# Patient Record
Sex: Male | Born: 1995 | Race: White | Hispanic: No | Marital: Married | Smoking: Current every day smoker
Health system: Southern US, Community
[De-identification: ages and names within clinical notes are randomized; demographics above are authoritative.]

## PROBLEM LIST (undated history)

## (undated) DIAGNOSIS — F329 Major depressive disorder, single episode, unspecified: Secondary | ICD-10-CM

## (undated) DIAGNOSIS — F419 Anxiety disorder, unspecified: Secondary | ICD-10-CM

## (undated) DIAGNOSIS — J45909 Unspecified asthma, uncomplicated: Secondary | ICD-10-CM

## (undated) DIAGNOSIS — I10 Essential (primary) hypertension: Secondary | ICD-10-CM

## (undated) DIAGNOSIS — F319 Bipolar disorder, unspecified: Secondary | ICD-10-CM

## (undated) DIAGNOSIS — F4310 Post-traumatic stress disorder, unspecified: Secondary | ICD-10-CM

## (undated) DIAGNOSIS — J449 Chronic obstructive pulmonary disease, unspecified: Secondary | ICD-10-CM

## (undated) DIAGNOSIS — F909 Attention-deficit hyperactivity disorder, unspecified type: Secondary | ICD-10-CM

## (undated) DIAGNOSIS — N289 Disorder of kidney and ureter, unspecified: Secondary | ICD-10-CM

## (undated) DIAGNOSIS — F32A Depression, unspecified: Secondary | ICD-10-CM

## (undated) DIAGNOSIS — R480 Dyslexia and alexia: Secondary | ICD-10-CM

## (undated) HISTORY — PX: EXPLORATORY LAPAROTOMY: SUR591

---
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
## 2013-01-17 ENCOUNTER — Emergency Department: Payer: Self-pay | Admitting: Emergency Medicine

## 2013-05-10 ENCOUNTER — Emergency Department: Payer: Self-pay | Admitting: Emergency Medicine

## 2014-06-05 ENCOUNTER — Emergency Department: Payer: Self-pay | Admitting: Emergency Medicine

## 2014-06-06 LAB — CBC
HCT: 38.3 % — ABNORMAL LOW (ref 40.0–52.0)
HGB: 12.7 g/dL — ABNORMAL LOW (ref 13.0–18.0)
MCH: 30 pg (ref 26.0–34.0)
MCHC: 33.2 g/dL (ref 32.0–36.0)
MCV: 90 fL (ref 80–100)
Platelet: 217 10*3/uL (ref 150–440)
RBC: 4.24 10*6/uL — ABNORMAL LOW (ref 4.40–5.90)
RDW: 12.8 % (ref 11.5–14.5)
WBC: 5.9 10*3/uL (ref 3.8–10.6)

## 2014-06-06 LAB — BASIC METABOLIC PANEL
ANION GAP: 5 — AB (ref 7–16)
BUN: 6 mg/dL — ABNORMAL LOW (ref 7–18)
CHLORIDE: 109 mmol/L — AB (ref 98–107)
CO2: 25 mmol/L (ref 21–32)
CREATININE: 0.92 mg/dL (ref 0.60–1.30)
Calcium, Total: 8.9 mg/dL (ref 8.5–10.1)
EGFR (African American): >60
GLUCOSE: 89 mg/dL (ref 65–99)
OSMOLALITY: 275 (ref 275–301)
Potassium: 4.2 mmol/L (ref 3.5–5.1)
SODIUM: 139 mmol/L (ref 136–145)

## 2015-05-23 ENCOUNTER — Emergency Department
Admission: EM | Admit: 2015-05-23 | Discharge: 2015-05-23 | Disposition: A | Discharge status: Home / Self Care | Payer: Self-pay | Attending: Emergency Medicine | Admitting: Emergency Medicine

## 2015-05-23 ENCOUNTER — Emergency Department: Payer: Self-pay

## 2015-05-23 ENCOUNTER — Encounter: Payer: Self-pay | Admitting: Urgent Care

## 2015-05-23 DIAGNOSIS — J441 Chronic obstructive pulmonary disease with (acute) exacerbation: Secondary | ICD-10-CM | POA: Insufficient documentation

## 2015-05-23 DIAGNOSIS — J45901 Unspecified asthma with (acute) exacerbation: Secondary | ICD-10-CM

## 2015-05-23 DIAGNOSIS — Z720 Tobacco use: Secondary | ICD-10-CM | POA: Insufficient documentation

## 2015-05-23 HISTORY — DX: Chronic obstructive pulmonary disease, unspecified: J44.9

## 2015-05-23 HISTORY — DX: Unspecified asthma, uncomplicated: J45.909

## 2015-05-23 HISTORY — DX: Disorder of kidney and ureter, unspecified: N28.9

## 2015-05-23 MED ORDER — ALBUTEROL SULFATE (2.5 MG/3ML) 0.083% IN NEBU
2.5000 mg | INHALATION_SOLUTION | Freq: Once | RESPIRATORY_TRACT | Status: AC
  Administered 2015-05-23: 2.5 mg via RESPIRATORY_TRACT

## 2015-05-23 MED ORDER — ALBUTEROL SULFATE HFA 108 (90 BASE) MCG/ACT IN AERS: 2.0000 | INHALATION_SPRAY | Freq: Four times a day (QID) | RESPIRATORY_TRACT | Status: DC | PRN

## 2015-05-23 MED ORDER — ALBUTEROL SULFATE (2.5 MG/3ML) 0.083% IN NEBU
INHALATION_SOLUTION | RESPIRATORY_TRACT | Status: AC
  Administered 2015-05-23: 2.5 mg via RESPIRATORY_TRACT
  Filled 2015-05-23: qty 3

## 2015-05-23 NOTE — ED Provider Notes (Signed)
[facility] Emergency Department Provider Note  ____________________________________________  Time seen: 2:00 AM  I have reviewed the triage vital signs and the nursing notes.   HISTORY  Chief Complaint Asthma     HPI Christopher Christensen is a 19 y.o. male 's with dyspnea and nonproductive cough and wheezing times one day. Patient states that he started breathing a [name] paper bag to help with his dyspnea which improved it slightly. Patient denies any chest pain   Past Medical History  Diagnosis Date  . Asthma   . Renal disorder   . COPD (chronic obstructive pulmonary disease)     There are no active problems to display for this patient.   Past surgical history None No current outpatient prescriptions on file.  Allergies No known drug allergies No family history on file.  Social History Social History  Substance Use Topics  . Smoking status: Current Every Day Smoker  . Smokeless tobacco: None  . Alcohol Use: Yes    Review of Systems  Constitutional: Negative for fever. Eyes: Negative for visual changes. ENT: Negative for sore throat. Cardiovascular: Negative for chest pain. Respiratory: Positive for shortness of breath. Gastrointestinal: Negative for abdominal pain, vomiting and diarrhea. Genitourinary: Negative for dysuria. Musculoskeletal: Negative for back pain. Skin: Negative for rash. Neurological: Negative for headaches, focal weakness or numbness.   10-point ROS otherwise negative.  ____________________________________________   PHYSICAL EXAM:  VITAL SIGNS: ED Triage Vitals  Enc Vitals Group     BP 05/23/15 0056 123/79 mmHg     Pulse Rate 05/23/15 0056 100     Resp 05/23/15 0056 20     Temp 05/23/15 0056 98.1 F (36.7 C)     Temp Source 05/23/15 0056 Oral     SpO2 05/23/15 0056 100 %     Weight 05/23/15 0056 151 lb (68.493 kg)     Height 05/23/15 0056  (1.727 m)     Head Cir --      Peak Flow --      Pain  Score 05/23/15 0057 0     Pain Loc --      Pain Edu? --      Excl. in GC? --     Constitutional: Alert and oriented. Well appearing and in no distress. Eyes: Conjunctivae are normal. PERRL. Normal extraocular movements. ENT   Head: Normocephalic and atraumatic.   Nose: No congestion/rhinnorhea.   Mouth/Throat: Mucous membranes are moist.   Neck: No stridor. Hematological/Lymphatic/Immunilogical: No cervical lymphadenopathy. Cardiovascular: Normal rate, regular rhythm. Normal and symmetric distal pulses are present in all extremities. No murmurs, rubs, or gallops. Respiratory: Normal respiratory effort without tachypnea nor retractions. Breath sounds are clear and equal bilaterally. Mild expiratory wheezes Gastrointestinal: Soft and nontender. No distention. There is no CVA tenderness. Genitourinary: deferred Musculoskeletal: Nontender with normal range of motion in all extremities. No joint effusions.  No lower extremity tenderness nor edema. Neurologic:  Normal speech and language. No gross focal neurologic deficits are appreciated. Speech is normal.  Skin:  Skin is warm, dry and intact. No rash noted. Psychiatric: Mood and affect are normal. Speech and behavior are normal. Patient exhibits appropriate insight and judgment.  __  RAD  INITIAL IMPRESSION / ASSESSMENT AND PLAN / ED COURSE  Pertinent labs & imaging results that were available during my care of the patient were reviewed by me and considered in my medical decision making (see chart for details).  Patient received nebulized albuterol resolution of wheezing. Patient will  be discharged home with an MDI.  ____________________________________________   FINAL CLINICAL IMPRESSION(S) / ED DIAGNOSES  Final diagnoses:  Mild asthma exacerbation      Darci Current, MD 05/23/15 [phone]

## 2015-05-23 NOTE — ED Notes (Signed)
Patient presents with c/o SOB. PMH significant for asthma; used MDI without relief of symptoms. Patient reporting that he has been breathing into a paper bag to help with his SOB. Of note, patient with no increased WOB noted in triage. Patient calm and able to speak in complete sentences; SPO2 100% on RA.

## 2015-05-23 NOTE — ED Notes (Signed)
Upon this RN entry to room, patient appears to be sleeping. Patient presents to ED with c/o SOB since yesterday but had increased this morning. Patient able to speak in complete sentences, respirations even and unlabored, no increased work in breathing noted. Patient alert and oriented x 4, call bell within reach, family at bedside.

## 2015-05-23 NOTE — ED Notes (Signed)

## 2015-05-23 NOTE — Discharge Instructions (Signed)

## 2015-06-20 ENCOUNTER — Encounter: Payer: Self-pay | Admitting: Urgent Care

## 2015-06-20 ENCOUNTER — Emergency Department
Admission: EM | Admit: 2015-06-20 | Discharge: 2015-06-20 | Disposition: A | Discharge status: Home / Self Care | Payer: Self-pay | Attending: Emergency Medicine | Admitting: Emergency Medicine

## 2015-06-20 DIAGNOSIS — Y9289 Other specified places as the place of occurrence of the external cause: Secondary | ICD-10-CM | POA: Insufficient documentation

## 2015-06-20 DIAGNOSIS — Y9389 Activity, other specified: Secondary | ICD-10-CM | POA: Insufficient documentation

## 2015-06-20 DIAGNOSIS — Y990 Civilian activity done for income or pay: Secondary | ICD-10-CM | POA: Insufficient documentation

## 2015-06-20 DIAGNOSIS — W310XXA Contact with mining and earth-drilling machinery, initial encounter: Secondary | ICD-10-CM | POA: Insufficient documentation

## 2015-06-20 DIAGNOSIS — S71131A Puncture wound without foreign body, right thigh, initial encounter: Secondary | ICD-10-CM | POA: Insufficient documentation

## 2015-06-20 DIAGNOSIS — I10 Essential (primary) hypertension: Secondary | ICD-10-CM | POA: Insufficient documentation

## 2015-06-20 DIAGNOSIS — Z720 Tobacco use: Secondary | ICD-10-CM | POA: Insufficient documentation

## 2015-06-20 HISTORY — DX: Anxiety disorder, unspecified: F41.9

## 2015-06-20 HISTORY — DX: Bipolar disorder, unspecified: F31.9

## 2015-06-20 HISTORY — DX: Attention-deficit hyperactivity disorder, unspecified type: F90.9

## 2015-06-20 HISTORY — DX: Essential (primary) hypertension: I10

## 2015-06-20 HISTORY — DX: Post-traumatic stress disorder, unspecified: F43.10

## 2015-06-20 HISTORY — DX: Depression, unspecified: F32.A

## 2015-06-20 HISTORY — DX: Major depressive disorder, single episode, unspecified: F32.9

## 2015-06-20 HISTORY — DX: Dyslexia and alexia: R48.0

## 2015-06-20 MED ORDER — ONDANSETRON 4 MG PO TBDP
4.0000 mg | ORAL_TABLET | Freq: Once | ORAL | Status: AC
  Administered 2015-06-20: 4 mg via ORAL
  Filled 2015-06-20: qty 1

## 2015-06-20 MED ORDER — AMOXICILLIN-POT CLAVULANATE 875-125 MG PO TABS
1.0000 | ORAL_TABLET | Freq: Once | ORAL | Status: AC
  Administered 2015-06-20: 1 via ORAL
  Filled 2015-06-20: qty 1

## 2015-06-20 MED ORDER — AMOXICILLIN-POT CLAVULANATE 875-125 MG PO TABS: 1.0000 | ORAL_TABLET | Freq: Two times a day (BID) | ORAL | Status: DC

## 2015-06-20 MED ORDER — NAPROXEN 500 MG PO TABS: 500.0000 mg | ORAL_TABLET | Freq: Two times a day (BID) | ORAL | Status: AC

## 2015-06-20 MED ORDER — ONDANSETRON HCL 4 MG PO TABS: 4.0000 mg | ORAL_TABLET | Freq: Three times a day (TID) | ORAL | Status: DC | PRN

## 2015-06-20 MED ORDER — NAPROXEN 500 MG PO TABS
500.0000 mg | ORAL_TABLET | ORAL | Status: AC
  Administered 2015-06-20: 500 mg via ORAL
  Filled 2015-06-20: qty 1
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
## 2016-05-05 ENCOUNTER — Encounter: Payer: Self-pay | Admitting: Emergency Medicine

## 2016-05-05 ENCOUNTER — Emergency Department
Admission: EM | Admit: 2016-05-05 | Discharge: 2016-05-05 | Disposition: A | Discharge status: Home / Self Care | Payer: Self-pay | Attending: Emergency Medicine | Admitting: Emergency Medicine

## 2016-05-05 DIAGNOSIS — Z5321 Procedure and treatment not carried out due to patient leaving prior to being seen by health care provider: Secondary | ICD-10-CM | POA: Insufficient documentation

## 2016-05-05 DIAGNOSIS — M545 Low back pain: Secondary | ICD-10-CM | POA: Insufficient documentation

## 2016-05-05 NOTE — ED Notes (Signed)
Pt to desk and states he can not wait any longer.  Pt states he will come back if pain gets worse.
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
## 2016-06-22 ENCOUNTER — Encounter: Payer: Self-pay | Admitting: Psychiatry

## 2016-06-22 ENCOUNTER — Inpatient Hospital Stay
Admission: AD | Admit: 2016-06-22 | Discharge: 2016-06-23 | DRG: 881 | Disposition: A | Discharge status: Home / Self Care | Payer: Self-pay | Source: Ambulatory Visit | Attending: Psychiatry | Admitting: Psychiatry

## 2016-06-22 ENCOUNTER — Emergency Department
Admission: EM | Admit: 2016-06-22 | Discharge: 2016-06-22 | Disposition: A | Discharge status: Other Acute Inpatient Hospital | Payer: Self-pay | Attending: Emergency Medicine | Admitting: Emergency Medicine

## 2016-06-22 DIAGNOSIS — F1120 Opioid dependence, uncomplicated: Secondary | ICD-10-CM | POA: Diagnosis present

## 2016-06-22 DIAGNOSIS — Z639 Problem related to primary support group, unspecified: Secondary | ICD-10-CM

## 2016-06-22 DIAGNOSIS — F909 Attention-deficit hyperactivity disorder, unspecified type: Secondary | ICD-10-CM | POA: Insufficient documentation

## 2016-06-22 DIAGNOSIS — F3162 Bipolar disorder, current episode mixed, moderate: Secondary | ICD-10-CM | POA: Insufficient documentation

## 2016-06-22 DIAGNOSIS — G4700 Insomnia, unspecified: Secondary | ICD-10-CM | POA: Diagnosis present

## 2016-06-22 DIAGNOSIS — F418 Other specified anxiety disorders: Secondary | ICD-10-CM | POA: Insufficient documentation

## 2016-06-22 DIAGNOSIS — F1220 Cannabis dependence, uncomplicated: Secondary | ICD-10-CM | POA: Diagnosis present

## 2016-06-22 DIAGNOSIS — F1210 Cannabis abuse, uncomplicated: Secondary | ICD-10-CM | POA: Insufficient documentation

## 2016-06-22 DIAGNOSIS — J45909 Unspecified asthma, uncomplicated: Secondary | ICD-10-CM | POA: Insufficient documentation

## 2016-06-22 DIAGNOSIS — Z823 Family history of stroke: Secondary | ICD-10-CM

## 2016-06-22 DIAGNOSIS — J449 Chronic obstructive pulmonary disease, unspecified: Secondary | ICD-10-CM | POA: Diagnosis present

## 2016-06-22 DIAGNOSIS — F1420 Cocaine dependence, uncomplicated: Secondary | ICD-10-CM | POA: Diagnosis present

## 2016-06-22 DIAGNOSIS — F1994 Other psychoactive substance use, unspecified with psychoactive substance-induced mood disorder: Secondary | ICD-10-CM | POA: Diagnosis present

## 2016-06-22 DIAGNOSIS — F17210 Nicotine dependence, cigarettes, uncomplicated: Secondary | ICD-10-CM | POA: Diagnosis present

## 2016-06-22 DIAGNOSIS — F4321 Adjustment disorder with depressed mood: Principal | ICD-10-CM | POA: Diagnosis present

## 2016-06-22 DIAGNOSIS — F1410 Cocaine abuse, uncomplicated: Secondary | ICD-10-CM | POA: Insufficient documentation

## 2016-06-22 DIAGNOSIS — Z79899 Other long term (current) drug therapy: Secondary | ICD-10-CM | POA: Insufficient documentation

## 2016-06-22 DIAGNOSIS — I10 Essential (primary) hypertension: Secondary | ICD-10-CM | POA: Insufficient documentation

## 2016-06-22 DIAGNOSIS — F4310 Post-traumatic stress disorder, unspecified: Secondary | ICD-10-CM | POA: Insufficient documentation

## 2016-06-22 DIAGNOSIS — F17200 Nicotine dependence, unspecified, uncomplicated: Secondary | ICD-10-CM | POA: Diagnosis present

## 2016-06-22 DIAGNOSIS — R480 Dyslexia and alexia: Secondary | ICD-10-CM | POA: Diagnosis present

## 2016-06-22 DIAGNOSIS — R45851 Suicidal ideations: Secondary | ICD-10-CM | POA: Diagnosis present

## 2016-06-22 DIAGNOSIS — Z716 Tobacco abuse counseling: Secondary | ICD-10-CM

## 2016-06-22 LAB — COMPREHENSIVE METABOLIC PANEL
ALBUMIN: 4.6 g/dL (ref 3.5–5.0)
ALK PHOS: 55 U/L (ref 38–126)
ALT: 10 U/L — ABNORMAL LOW (ref 17–63)
AST: 19 U/L (ref 15–41)
Anion gap: 5 (ref 5–15)
BILIRUBIN TOTAL: 0.1 mg/dL — AB (ref 0.3–1.2)
BUN: 8 mg/dL (ref 6–20)
CALCIUM: 9.4 mg/dL (ref 8.9–10.3)
CO2: 29 mmol/L (ref 22–32)
CREATININE: 1.02 mg/dL (ref 0.61–1.24)
Chloride: 103 mmol/L (ref 101–111)
GFR calc Af Amer: >60 mL/min (ref >60)
GLUCOSE: 105 mg/dL — AB (ref 65–99)
Potassium: 3.9 mmol/L (ref 3.5–5.1)
Sodium: 137 mmol/L (ref 135–145)
TOTAL PROTEIN: 7.5 g/dL (ref 6.5–8.1)

## 2016-06-22 LAB — URINE DRUG SCREEN, QUALITATIVE (ARMC ONLY)
AMPHETAMINES, UR SCREEN: NOT DETECTED
BARBITURATES, UR SCREEN: NOT DETECTED
BENZODIAZEPINE, UR SCRN: NOT DETECTED
CANNABINOID 50 NG, UR ~~LOC~~: POSITIVE — AB
Cocaine Metabolite,Ur ~~LOC~~: POSITIVE — AB
MDMA (Ecstasy)Ur Screen: NOT DETECTED
Methadone Scn, Ur: NOT DETECTED
OPIATE, UR SCREEN: POSITIVE — AB
PHENCYCLIDINE (PCP) UR S: NOT DETECTED
Tricyclic, Ur Screen: NOT DETECTED

## 2016-06-22 LAB — CBC
HEMATOCRIT: 41.6 % (ref 40.0–52.0)
Hemoglobin: 14.6 g/dL (ref 13.0–18.0)
MCH: 31.3 pg (ref 26.0–34.0)
MCHC: 35.2 g/dL (ref 32.0–36.0)
MCV: 89 fL (ref 80.0–100.0)
PLATELETS: 292 10*3/uL (ref 150–440)
RBC: 4.68 MIL/uL (ref 4.40–5.90)
RDW: 12.4 % (ref 11.5–14.5)
WBC: 9.2 10*3/uL (ref 3.8–10.6)

## 2016-06-22 LAB — SALICYLATE LEVEL: Salicylate Lvl: <4 mg/dL (ref 2.8–30.0)

## 2016-06-22 LAB — ETHANOL

## 2016-06-22 LAB — ACETAMINOPHEN LEVEL: Acetaminophen (Tylenol), Serum: <10 ug/mL — ABNORMAL LOW (ref 10–30)

## 2016-06-22 MED ORDER — CITALOPRAM HYDROBROMIDE 20 MG PO TABS
20.0000 mg | ORAL_TABLET | Freq: Every day | ORAL | Status: DC
  Administered 2016-06-22 – 2016-06-23 (×2): 20 mg via ORAL
  Filled 2016-06-22 (×2): qty 1

## 2016-06-22 MED ORDER — NICOTINE 14 MG/24HR TD PT24
14.0000 mg | MEDICATED_PATCH | Freq: Every day | TRANSDERMAL | Status: DC
  Administered 2016-06-22 – 2016-06-23 (×2): 14 mg via TRANSDERMAL
  Filled 2016-06-22 (×2): qty 1

## 2016-06-22 MED ORDER — ALUM & MAG HYDROXIDE-SIMETH 200-200-20 MG/5ML PO SUSP: 30.0000 mL | ORAL | Status: DC | PRN

## 2016-06-22 MED ORDER — ACETAMINOPHEN 325 MG PO TABS: 650.0000 mg | ORAL_TABLET | Freq: Four times a day (QID) | ORAL | Status: DC | PRN

## 2016-06-22 MED ORDER — MAGNESIUM HYDROXIDE 400 MG/5ML PO SUSP: 30.0000 mL | Freq: Every day | ORAL | Status: DC | PRN

## 2016-06-22 MED ORDER — TRAZODONE HCL 100 MG PO TABS: 100.0000 mg | ORAL_TABLET | Freq: Every evening | ORAL | Status: DC | PRN

## 2016-06-22 MED ORDER — ALBUTEROL SULFATE HFA 108 (90 BASE) MCG/ACT IN AERS
2.0000 | INHALATION_SPRAY | Freq: Four times a day (QID) | RESPIRATORY_TRACT | Status: DC | PRN
  Filled 2016-06-22: qty 6.7

## 2016-06-22 NOTE — ED Notes (Signed)
Patient able to discuss issues with RN. Wife left him yesterday, taking their three children. Patient says he had a "meltdown" and said/wrote things to his family that he did not mean to say. Able to see his family's concerns.  Patient continues to assert that he is not a risk to himself and family will lock up guns in the house.  Maintained on 15 minute checks and observation by security camera for safety.

## 2016-06-22 NOTE — ED Notes (Signed)
Report to Margaret, RN in the ED BHU.  

## 2016-06-22 NOTE — ED Notes (Signed)
Patient received breakfast tray 

## 2016-06-22 NOTE — ED Notes (Signed)

## 2016-06-22 NOTE — ED Notes (Signed)
Patient asleep in room. No noted distress or abnormal behavior. Will continue 15 minute checks and observation by security cameras for safety. 

## 2016-06-22 NOTE — Tx Team (Signed)
Initial Treatment Plan 06/22/2016 2:23 PM Christopher Chacoustin J [name] [id number]    PATIENT STRESSORS: Financial difficulties Marital or family conflict Substance abuse   PATIENT STRENGTHS: Ability for insight Active sense of humor Communication skills Supportive family/friends   PATIENT IDENTIFIED PROBLEMS: Suicide  06/22/2016  Depression 06/22/16  Substance Abuse  06/22/16                 DISCHARGE CRITERIA:  Ability to meet basic life and health needs Improved stabilization in mood, thinking, and/or behavior Withdrawal symptoms are absent or subacute and managed without 24-hour nursing intervention  PRELIMINARY DISCHARGE PLAN: Outpatient therapy Return to previous living arrangement  PATIENT/FAMILY INVOLVEMENT: This treatment plan has been presented to and reviewed with the patient, Christopher Christensen, and/or family member, .  The patient and family have been given the opportunity to ask questions and make suggestions.  Christopher InfanteGwen A Zarin Knupp, RN 06/22/2016, 2:23 PM

## 2016-06-22 NOTE — ED Notes (Signed)
With the patients permission I spoke with his grandmother Ms. Duggins to obtain collaborative information about the patient and what happened tonight. Ms Izola PriceDuggins reports she has noticed an increase in patients depression over the last week. Pt has been going to his mothers grave this week and has not been there since the funeral. Gearldine ShownGrandmother feels the patient was serious tonight and voiced concerns because there are guns in the household. Grandmother is willing to speak with [facility]. Will have SOC notified.

## 2016-06-22 NOTE — ED Notes (Signed)
BEHAVIORAL HEALTH ROUNDING  Patient sleeping: No.  Patient alert and oriented: yes  Behavior appropriate: Yes. ; If no, describe:  Nutrition and fluids offered: Yes  Toileting and hygiene offered: Yes  Sitter present: not applicable, Q 15 min safety rounds and observation.  Law enforcement present: Yes ODS  

## 2016-06-22 NOTE — [facility] Group Notes (Signed)
[facility] LCSW Group Therapy  06/22/2016 2:09 PM  Type of Therapy:  Group Therapy  Participation Level:  Patient did not attend group. CSW invited patient to group.   Summary of Progress/Problems:Self esteem: Patients discussed self esteem and how it impacts them. They discussed what aspects in their lives has influenced their self esteem. They were challenged to identify changes that are needed in order to improve self esteem. Patients participated in activity where they had to identify positive adjectives they felt described their personality. Patients shared with the group on the following areas: Things I am good at, What I like about my appearance, I've helped others by, What I value the most, compliments I have received, challenges I have overcome, thing that make me unique, and Times I've made others happy.    Tonnya Garbett G. Garnette CzechSampson MSW, LCSWA 06/22/2016, 2:10 PM

## 2016-06-22 NOTE — [facility] Suicide Risk Assessment (Signed)
[facility] INPATIENT:  Family/Significant Other Suicide Prevention Education  Suicide Prevention Education:  Education Completed;Debbie Douggins (grandmother [phone]), has been identified by the patient as the family member/significant other with whom the patient will be residing, and identified as the person(s) who will aid the patient in the event of a mental health crisis (suicidal ideations/suicide attempt).  With written consent from the patient, the family member/significant other has been provided the following suicide prevention education, prior to the and/or following the discharge of the patient.  The suicide prevention education provided includes the following:  Suicide risk factors  Suicide prevention and interventions  National Suicide Hotline telephone number  [facility] assessment telephone number  [facility] Emergency Assistance 911  [facility] and/or Residential Mobile Crisis Unit telephone number  Request made of family/significant other to:  Remove weapons (e.g., guns, rifles, knives), all items previously/currently identified as safety concern.    Remove drugs/medications (over-the-counter, prescriptions, illicit drugs), all items previously/currently identified as a safety concern.  The family member/significant other verbalizes understanding of the suicide prevention education information provided.  The family member/significant other agrees to remove the items of safety concern listed above.  Deanna Wiater G. Garnette CzechSampson MSW, LCSWA 06/22/2016, 3:18 PM

## 2016-06-22 NOTE — ED Notes (Signed)
Report given to floor nurse Gwen. Patient to be transported to [facility] unit for continued treatment. All personal belongings sent with patient.
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
## 2016-06-22 NOTE — ED Provider Notes (Signed)
Dr. Maryruth BunKapur accepts for admission.  I placed the discharge-readmit order.   Governor Rooksebecca Aadhira Heffernan, MD 06/22/16 1248
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
## 2016-06-22 NOTE — BH Assessment (Signed)
Tele Assessment Note   Christopher Christensen is a 20 year old white male that denies SI/HI/Psychosis/Substance Abuse.   Patient reports increased depression associated with his wife leaving him today.  Patient also report that he was upset about the anniversary of his mother's death approximately one year ago.    Patient reports that although he was upset and may have made some statement out of anger and sadness. Patient reports that he does not remember what he said and he would not harm himself or anyone else.   Documentation in the epic chart reports that the patient stated to his family that he had a plan to run his car into a bridge in order to kill himself.  Patient has written notes to family members apologizing for being a "bad brother and child".  Patient reports that he too much to live for.  Patient report that he has a job interview in the morning for [employer].  Patient reports that he takes care of his elderly grandparents.   Patient reports that he has children that are depending on him to be there emotionally and financially.   Diagnosis: Major Depressive Disorder and Anxiety Disorder   Past Medical History:  Past Medical History:  Diagnosis Date  . ADHD (attention deficit hyperactivity disorder)   . Anxiety   . Asthma   . Bipolar disorder (HCC)   . COPD (chronic obstructive pulmonary disease) (HCC)   . Depression   . Dyslexia   . Hypertension   . PTSD (post-traumatic stress disorder)   . Renal disorder     No past surgical history on file.  Family History: No family history on file.  Social History:  reports that he has been smoking.  He has never used smokeless tobacco. He reports that he drinks alcohol. His drug history is not on file.  Additional Social History:  Alcohol / Drug Use History of alcohol / drug use?: No history of alcohol / drug abuse  CIWA: CIWA-Ar BP: 126/78 Pulse Rate: 78 COWS:    PATIENT STRENGTHS: (choose at least two) Average or above  average intelligence Capable of independent living Communication skills Physical Health  Allergies: No Known Allergies  Home Medications:  (Not in a hospital admission)  OB/GYN Status:  No LMP for male patient.  General Assessment Data Location of Assessment: [facility] ED TTS Assessment: In system Is this a Tele or Face-to-Face Assessment?: Tele Assessment Is this an Initial Assessment or a Re-assessment for this encounter?: Initial Assessment Marital status: Married [place] name: NA Is patient pregnant?: No Pregnancy Status: No Living Arrangements:  (gRANDPARENTS) Can pt return to current living arrangement?: Yes Admission Status: Involuntary Is patient capable of signing voluntary admission?: No Referral Source: Self/Family/Friend Insurance type: sELF pAY  Medical Screening Exam [facility] Walk-in ONLY) Reason for MSE not completed:  (na)  Crisis Care Plan Living Arrangements:  (gRANDPARENTS) Legal Guardian:  (NA) Name of Psychiatrist: NA Name of Therapist: NA  Education Status Is patient currently in school?: No Current Grade: NA Highest grade of school patient has completed: NA Name of school: NA Contact person: NA  Risk to self with the past 6 months Suicidal Ideation: No Has patient been a risk to self within the past 6 months prior to admission? : No Suicidal Intent: No Has patient had any suicidal intent within the past 6 months prior to admission? : No Is patient at risk for suicide?: No Suicidal Plan?: No Has patient had any suicidal plan within the past 6  months prior to admission? : No Access to Means: No What has been your use of drugs/alcohol within the last 12 months?: nONE rEPORTED Previous Attempts/Gestures: No How many times?: 0 Other Self Harm Risks: NA Triggers for Past Attempts:  (NA) Intentional Self Injurious Behavior: None Family Suicide History: No Recent stressful life event(s): Divorce (Wife left him today) Persecutory voices/beliefs?:  No Depression: Yes Depression Symptoms: Feeling angry/irritable, Loss of interest in usual pleasures, Fatigue, Guilt Substance abuse history and/or treatment for substance abuse?: No Suicide prevention information given to non-admitted patients: Yes  Risk to Others within the past 6 months Homicidal Ideation: No Does patient have any lifetime risk of violence toward others beyond the six months prior to admission? : No Thoughts of Harm to Others: No Current Homicidal Intent: No Current Homicidal Plan: No Access to Homicidal Means: No Identified Victim: NA History of harm to others?: No Assessment of Violence: None Noted Violent Behavior Description: NA Does patient have access to weapons?: No Criminal Charges Pending?: No Does patient have a court date: No Is patient on probation?: No  Psychosis Hallucinations: None noted Delusions: None noted  Mental Status Report Appearance/Hygiene: Disheveled Eye Contact: Good Motor Activity: Freedom of movement Level of Consciousness: Restless, Alert, Irritable Mood: Depressed, Anxious Affect: Anxious, Blunted, Depressed Anxiety Level: None Thought Processes: Coherent, Relevant Judgement: Unimpaired Orientation: Person, Place, Time, Situation Obsessive Compulsive Thoughts/Behaviors: None  Cognitive Functioning Concentration: Decreased Memory: Recent Intact, Remote Intact IQ: Average Insight: Fair Impulse Control: Fair Appetite: Fair Weight Loss: 0 Weight Gain: 0 Sleep: Decreased Total Hours of Sleep: 5 Vegetative Symptoms: Not bathing  ADLScreening [facility] Assessment Services) Patient's cognitive ability adequate to safely complete daily activities?: Yes Patient able to express need for assistance with ADLs?: Yes Independently performs ADLs?: Yes (appropriate for developmental age)  Prior Inpatient Therapy Prior Inpatient Therapy: No Prior Therapy Dates: NA Prior Therapy Facilty/Provider(s): NA Reason for Treatment:  NA  Prior Outpatient Therapy Prior Outpatient Therapy: No Prior Therapy Dates: NA Prior Therapy Facilty/Provider(s): NA Reason for Treatment: NA Does patient have an ACCT team?: No Does patient have Intensive In-House Services?  : No Does patient have [facility] services? : No Does patient have P4CC services?: No  ADL Screening (condition at time of admission) Patient's cognitive ability adequate to safely complete daily activities?: Yes Is the patient deaf or have difficulty hearing?: No Does the patient have difficulty seeing, even when wearing glasses/contacts?: No Does the patient have difficulty concentrating, remembering, or making decisions?: Yes Patient able to express need for assistance with ADLs?: Yes Does the patient have difficulty dressing or bathing?: No Independently performs ADLs?: Yes (appropriate for developmental age) Does the patient have difficulty walking or climbing stairs?: No Weakness of Legs: None Weakness of Arms/Hands: None  Home Assistive Devices/Equipment Home Assistive Devices/Equipment: None    Abuse/Neglect Assessment (Assessment to be complete while patient is alone) Physical Abuse: Denies Verbal Abuse: Denies Sexual Abuse: Denies Exploitation of patient/patient's resources: Denies Self-Neglect: Denies Values / Beliefs Cultural Requests During Hospitalization: None Spiritual Requests During Hospitalization: None Consults Spiritual Care Consult Needed: No Social Work Consult Needed: No Merchant navy officer (For Healthcare) Does patient have an advance directive?: No Would patient like information on creating an advanced directive?: No - patient declined information    Additional Information 1:1 In Past 12 Months?: No CIRT Risk: No Elopement Risk: No Does patient have medical clearance?: Yes     Disposition: Pending psych disposition. Disposition Initial Assessment Completed for this Encounter: Yes  Elisabeth Most, Mckoy Bhakta  [place] 06/22/2016  2:20 AM

## 2016-06-22 NOTE — ED Notes (Signed)
BEHAVIORAL HEALTH ROUNDING  Patient sleeping: No.  Patient alert and oriented: yes  Behavior appropriate: Yes. ; If no, describe:  Nutrition and fluids offered: Yes  Toileting and hygiene offered: Yes  Sitter present: not applicable, Q 15 min safety rounds and observation.  Law enforcement present: Yes ODS ENVIRONMENTAL ASSESSMENT  Potentially harmful objects out of patient reach: Yes.  Personal belongings secured: Yes.  Patient dressed in hospital provided attire only: Yes.  Plastic bags out of patient reach: Yes.  Patient care equipment (cords, cables, call bells, lines, and drains) shortened, removed, or accounted for: Yes.  Equipment and supplies removed from bottom of stretcher: Yes.  Potentially toxic materials out of patient reach: Yes.  Sharps container removed or out of patient reach: Yes.   

## 2016-06-22 NOTE — ED Provider Notes (Signed)
[facility] Emergency Department Provider Note   ____________________________________________   First MD Initiated Contact with Patient 06/22/16 [phone]     (approximate)  I have reviewed the triage vital signs and the nursing notes.   HISTORY  Chief Complaint Psychiatric Evaluation    HPI Christopher Christensen is a 20 y.o. male brought to the ED by police under IVC for polysubstance abuse, depression and suicidal ideation.Patient states it was on misunderstanding. He was upset about the anniversary of his mother's death approximately one year ago and "said some stupid things that I didn't mean". Reports he has a job interview in the morning for [employer]. Admits his wife left him today and that's why he "said some things". Denies current and active SI/HI/AH/VH. Denies fever, chills, chest pain, shortness of breath, abdominal pain, nausea, vomiting, diarrhea. Denies recent travel or trauma.   Past Medical History:  Diagnosis Date  . ADHD (attention deficit hyperactivity disorder)   . Anxiety   . Asthma   . Bipolar disorder (HCC)   . COPD (chronic obstructive pulmonary disease) (HCC)   . Depression   . Dyslexia   . Hypertension   . PTSD (post-traumatic stress disorder)   . Renal disorder     There are no active problems to display for this patient.   No past surgical history on file.  Prior to Admission medications   Medication Sig Start Date End Date Taking? Authorizing Provider  albuterol (PROVENTIL HFA;VENTOLIN HFA) 108 (90 BASE) MCG/ACT inhaler Inhale 2 puffs into the lungs every 6 (six) hours as needed for wheezing or shortness of breath. 05/23/15  Yes Darci Currentandolph N Brown, MD  Aspirin-Caffeine [phone] MG PACK Take 1 Package by mouth every 6 (six) hours as needed (pain).   Yes Historical Provider, MD  diphenhydramine-acetaminophen (TYLENOL PM) 25-500 MG TABS tablet Take 2 tablets by mouth at bedtime as needed (sleep).   Yes Historical Provider, MD     Allergies Review of patient's allergies indicates no known allergies.  No family history on file.  Social History Social History  Substance Use Topics  . Smoking status: Current Every Day Smoker  . Smokeless tobacco: Never Used  . Alcohol use Yes    Review of Systems  Constitutional: No fever/chills. Eyes: No visual changes. ENT: No sore throat. Cardiovascular: Denies chest pain. Respiratory: Denies shortness of breath. Gastrointestinal: No abdominal pain.  No nausea, no vomiting.  No diarrhea.  No constipation. Genitourinary: Negative for dysuria. Musculoskeletal: Negative for back pain. Skin: Negative for rash. Neurological: Negative for headaches, focal weakness or numbness. Psychiatric:Positive for depression.  10-point ROS otherwise negative.  ____________________________________________   PHYSICAL EXAM:  VITAL SIGNS: ED Triage Vitals  Enc Vitals Group     BP 06/22/16 0013 126/78     Pulse Rate 06/22/16 0013 78     Resp 06/22/16 0013 18     Temp 06/22/16 0013 97.8 F (36.6 C)     Temp Source 06/22/16 0013 Oral     SpO2 06/22/16 0013 100 %     Weight 06/22/16 0014 141 lb (64 kg)     Height 06/22/16 0014 5\' 9"  (1.753 m)     Head Circumference --      Peak Flow --      Pain Score --      Pain Loc --      Pain Edu? --      Excl. in GC? --     Constitutional: Alert and oriented. Well appearing  and in no acute distress. Eyes: Conjunctivae are normal. PERRL. EOMI. Head: Atraumatic. Nose: No congestion/rhinnorhea. Mouth/Throat: Mucous membranes are moist.  Oropharynx non-erythematous. Neck: No stridor.   Cardiovascular: Normal rate, regular rhythm. Grossly normal heart sounds.  Good peripheral circulation. Respiratory: Normal respiratory effort.  No retractions. Lungs CTAB. Gastrointestinal: Soft and nontender. No distention. No abdominal bruits. No CVA tenderness. Musculoskeletal: No lower extremity tenderness nor edema.  No joint  effusions. Neurologic:  Normal speech and language. No gross focal neurologic deficits are appreciated. No gait instability. Skin:  Skin is warm, dry and intact. No rash noted. Psychiatric: Mood and affect are normal. Speech and behavior are normal.  ____________________________________________   LABS (all labs ordered are listed, but only abnormal results are displayed)  Labs Reviewed  COMPREHENSIVE METABOLIC PANEL - Abnormal; Notable for the following:       Result Value   Glucose, Bld 105 (*)    ALT 10 (*)    Total Bilirubin 0.1 (*)    All other components within normal limits  ACETAMINOPHEN LEVEL - Abnormal; Notable for the following:    Acetaminophen (Tylenol), Serum <10 (*)    All other components within normal limits  URINE DRUG SCREEN, QUALITATIVE ([facility] ONLY) - Abnormal; Notable for the following:    Cocaine Metabolite,Ur [place] POSITIVE (*)    Opiate, Ur Screen POSITIVE (*)    Cannabinoid 50 Ng, Ur Abbott POSITIVE (*)    All other components within normal limits  ETHANOL  SALICYLATE LEVEL  CBC   ____________________________________________  EKG  None ____________________________________________  RADIOLOGY  None ____________________________________________   PROCEDURES  Procedure(s) performed: None  Procedures  Critical Care performed: No  ____________________________________________   INITIAL IMPRESSION / ASSESSMENT AND PLAN / ED COURSE  Pertinent labs & imaging results that were available during my care of the patient were reviewed by me and considered in my medical decision making (see chart for details).  20 year old male with a history of bipolar disorder, ADHD and PTSD who presents under IVC for suicidal thoughts. Patient is medically cleared for psychiatric disposition. Will consult TTS and telepsych to evaluate patient.  Clinical Course  Comment By Time  Patient was evaluated by [facility] psychiatrist who recommends inpatient hospitalization. Evidently  patient had access to a hand gun yesterday and threatened to kill himself with it. Will maintain IVC. Disposition per psychiatry. Irean Hong, MD 09/24 [phone]     ____________________________________________   FINAL CLINICAL IMPRESSION(S) / ED DIAGNOSES  Final diagnoses:  Bipolar disorder, current episode mixed, moderate (HCC)  Attention deficit hyperactivity disorder (ADHD), unspecified ADHD type  PTSD (post-traumatic stress disorder)  Cocaine abuse  Marijuana abuse      NEW MEDICATIONS STARTED DURING THIS VISIT:  New Prescriptions   No medications on file     Note:  This document was prepared using Dragon voice recognition software and may include unintentional dictation errors.    Irean Hong, MD 06/22/16 [phone]

## 2016-06-22 NOTE — Plan of Care (Signed)
Problem: Coping: Goal: Ability to verbalize frustrations and anger appropriately will improve Outcome: Not Progressing New admission  Patient in denial a bout being on unit

## 2016-06-22 NOTE — ED Notes (Signed)
[facility] and computer turned on. Pt awakened and aware of pending interview.

## 2016-06-22 NOTE — ED Notes (Signed)
Report was received from Reece LeaderAnn C., RN; Pt. Verbalizes no complaints or distress; verbalizes having S.I.; with a plan to drive his car off a bridge; has a H/O depression; mother died a year ago; lost his job 1 or 2 weeks ago; with polysubstance use. denies having Hi.; Continue to monitor with 15 min. Monitoring.

## 2016-06-22 NOTE — Progress Notes (Signed)
[name] by Dr. Dr. Maryruth BunKapur.  Attending Physician will be Dr. Jennet MaduroPucilowska.   Patient [facility] 311, [facility] Wet[facility].   Intake Paper Work has been signed and placed on patient chart.  ER staff is aware of the admission Christopher Christensen( Ronnie, ER Sect.; Dr. Shaune PollackLord, ER MD; Amy, Patient's Nurse & Nedra HaiLee, Patient Access). [facility], LCAS-A, LPC-A, [facility]  Counselor 06/22/2016 10:49 AM\

## 2016-06-22 NOTE — Progress Notes (Signed)
Admission Note:  D : Suicidal 20 year  Old white male in under the service of Dr. Maryruth BunKapur. Admittting diagnosis of Depression /Suicidal. Plan to run car into tree. Brother IVC him here. Patient  had written good bye notes to family members , has been visting  His mother's grave . Girl friend left him with her three kids to biological father . He calls her his  Wife and his kids Pt appeared depressed  With  a flat affect.   cooperative with assessment.      A: Pt admitted to unit per protocol, skin assessment and search done and no contraband found tattoos   Located on body both arm , both legs  Back of neck. .  Pt  educated on therapeutic milieu rules. Pt was introduced to milieu by nursing staff.    R: Pt was receptive to education about the milieu .  15 min safety checks started. Clinical research associatewriter offered support

## 2016-06-22 NOTE — ED Notes (Signed)
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
## 2016-06-22 NOTE — ED Notes (Signed)
Patient told that plan was for inpatient treatment. Patient very angry about this. He said that he has a family to take care of and a job interview tomorrow. Wants to speak with a doctor again. This information was conveyed to TTS, Sean. Patient calling his brother at this time.

## 2016-06-22 NOTE — ED Notes (Signed)
BEHAVIORAL HEALTH ROUNDING Patient sleeping: Yes.   Patient alert and oriented: not applicable SLEEPING Behavior appropriate: Yes.  ; If no, describe: SLEEPING Nutrition and fluids offered: No SLEEPING Toileting and hygiene offered: NoSLEEPING Sitter present: not applicable, Q 15 min safety rounds and observation. Law enforcement present: Yes ODS 

## 2016-06-22 NOTE — ED Notes (Signed)
Pt placed in Rm 26 for TTS interview via computer. Pt calm and cooperative at this time.

## 2016-06-22 NOTE — ED Notes (Signed)
Patient alert and oriented on interview. He denies depressed mood. Denies SI or HI. He says that he did endorse suicidal ideation and plan yesterday but he did not really plan on carrying it through.  Patient was cooperative with all nursing interventions. He is aware that he will need to be re-evaluated today regarding disposition. Maintained on 15 minute checks and observation by security camera for safety.

## 2016-06-22 NOTE — ED Triage Notes (Signed)
Patient presents to the ED in custody of [employer] officer; under IVC at this time. Papers indicate that patient has a history of polysubstance abuse. Patient's wife left him and he has been increasingly depressed. Patient with (+) SI; wants to run car into a bridge in order to kill himself. Patient has written notes to family members apologizing for being a "bad brother and child".

## 2016-06-23 DIAGNOSIS — F4321 Adjustment disorder with depressed mood: Principal | ICD-10-CM

## 2016-06-23 MED ORDER — ALBUTEROL SULFATE HFA 108 (90 BASE) MCG/ACT IN AERS: 2.0000 | INHALATION_SPRAY | Freq: Four times a day (QID) | RESPIRATORY_TRACT | 2 refills | Status: DC | PRN

## 2016-06-23 MED ORDER — CITALOPRAM HYDROBROMIDE 20 MG PO TABS: 20.0000 mg | ORAL_TABLET | Freq: Every day | ORAL | 1 refills | Status: DC

## 2016-06-23 NOTE — Progress Notes (Signed)
D: Pt denies SI/HI/AVH. Visible in milieu interacting with peers appropriately. Minimal interaction with staff. Minimizes situation. Affect flat. Guarded. Denies pain. A: Encouragement and support provided. q15 minute checks maintained for safety.  R: Pt remains safe on unit. Voices no additional concerns at this time.

## 2016-06-23 NOTE — Plan of Care (Signed)
Problem: Coping: Goal: Ability to cope will improve Outcome: Progressing Working on coping skills   
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
## 2016-06-23 NOTE — Progress Notes (Addendum)
D:Patient aware of discharge this shift . Patient returning home . Patient received all belonging locked up . Patient denies  Suicidal  And homicidal ideations  .  A: Writer instructed on discharge criteria  . Informed of 7 day supply  Of medication Instructed on discharge summery , transitional record , suicidal assessment and prescriptions  given  . Aware  Of follow up appointment . R: Patient left unit with no questions  Or concerns  With brother

## 2016-06-23 NOTE — [facility] Group Notes (Signed)
[facility] LCSW Group Therapy   06/23/2016 9:30 am Type of Therapy: Group Therapy   Participation Level: Pt invited but did not attend.   Participation Quality: Pt invited but did not attend.  Hampton AbbotKadijah Trevione Wert, MSW, LCSWA 06/23/2016, 11:37AM

## 2016-06-23 NOTE — [facility] Suicide Risk Assessment (Signed)
[facility]   Principal Problem: Adjustment disorder with depressed mood Discharge Diagnoses:  Patient Active Problem List   Diagnosis Date Noted  . Adjustment disorder with depressed mood [F43.21] 06/22/2016  . Cocaine use disorder, moderate, dependence (HCC) [F14.20] 06/22/2016  . Cannabis use disorder, moderate, dependence (HCC) [F12.20] 06/22/2016  . Opioid use disorder, moderate, dependence (HCC) [F11.20] 06/22/2016  . Substance induced mood disorder (HCC) [F19.94] 06/22/2016  . Tobacco use disorder [F17.200] 06/22/2016    Total Time spent with patient: 30 minutes  Musculoskeletal: Strength & Muscle Tone: within normal limits Gait & Station: normal Patient leans: N/A  Psychiatric Specialty Exam: Review of Systems  Psychiatric/Behavioral: Positive for substance abuse.  All other systems reviewed and are negative.   Blood pressure 116/72, pulse (!) 56, temperature 98.2 F (36.8 C), temperature source Oral, resp. rate 18, height 5\' 9"  (1.753 m), weight 63 kg (139 lb), SpO2 100 %.Body mass index is 20.53 kg/m.  General Appearance: Casual  Eye Contact::  Good  Speech:  Clear and Coherent409  Volume:  Normal  Mood:  Euthymic  Affect:  Appropriate  Thought Process:  Goal Directed  Orientation:  Full (Time, Place, and Person)  Thought Content:  WDL  Suicidal Thoughts:  No  Homicidal Thoughts:  No  Memory:  Immediate;   Fair Recent;   Fair Remote;   Fair  Judgement:  Impaired  Insight:  Present  Psychomotor Activity:  Normal  Concentration:  Fair  Recall:  FiservFair  Fund of Knowledge:Fair  Language: Fair  Akathisia:  No  Handed:  Right  AIMS (if indicated):     Assets:  Communication Skills Desire for Improvement Housing Physical Health Resilience Social Support  Sleep:  Number of Hours: 6.15  Cognition: WNL  ADL's:  Intact   Mental Status Per Nursing Assessment::   On Admission:     Demographic Factors:  Male, Adolescent or young  adult, Caucasian and Low socioeconomic status  Loss Factors: Loss of significant relationship and Financial problems/change in socioeconomic status  Historical Factors: Family history of mental illness or substance abuse and Impulsivity  Risk Reduction Factors:   Responsible for children under 418 years of age, Sense of responsibility to family, Employed, Living with another person, especially a relative and Positive social support  Continued Clinical Symptoms:  Depression:   Comorbid alcohol abuse/dependence Impulsivity Alcohol/Substance Abuse/Dependencies  Cognitive Features That Contribute To Risk:  None    Suicide Risk:  Minimal: No identifiable suicidal ideation.  Patients presenting with no risk factors but with morbid ruminations; may be classified as minimal risk based on the severity of the depressive symptoms  Follow-up Information    [facility]. Go in 3 day(s).   Why:  Walk-in hours for new patients are M,W,F 8a-3p. Arrive early to ensure prompt service. Please bring your ID, discharge summary, and current prescriptions.  Contact information: [address] [place] [address] [phone]           Plan Of Care/Follow-up recommendations:  Activity:  as tolerated. Diet:  regular. Other:  keep follow up appointments.  Kristine LineaJolanta Hindy Perrault, MD 06/23/2016, 1:03 PM

## 2016-06-23 NOTE — Progress Notes (Signed)
Recreation Therapy Notes  Date: 09.25.17 Time: 1:00 pm Location: Craft Room  Group Topic: Wellness  Goal Area(s) Addresses:  Patient will identify at least one item per dimension of health. Patient will examine areas they are deficient in.  Behavioral Response: Attentive, Interactive  Intervention: 6 Dimensions of Health  Activity: Patients were definition sheets of the 6 dimensions of health and a worksheet with the dimensions on it. Patients were instructed to write 2-3 items in each category they were currently doing.  Education: LRT educated patients on ways to improve each dimension.  Education Outcome: Acknowledges education/In group clarification offered  Clinical Observations/Feedback: Patient completed activity by writing at least one item in each dimension. Patient contributed to group discussion by stating what areas he is lacking in, ways he can improve certain areas, and how the activity relates to his d/c.  Jacquelynn CreeGreene,Kadra Kohan M, LRT/CTRS 06/23/2016 3:33 PM

## 2016-06-24 NOTE — Progress Notes (Signed)
  [facility] :  Will you be returning to the same living situation after discharge:  Yes,    At discharge, do you have transportation home?: Yes,    Do you have the ability to pay for your medications: Yes,     Release of information consent forms completed and in the chart;  Patient's signature needed at discharge.  Patient to Follow up at: Follow-up Information    [facility]).   Why:  Walk-in hours for new patients are M,W,F 8a-3p. Arrive early to ensure prompt service. Please bring your ID, discharge summary, and current prescriptions.  Contact information: [address] [place] [address] [phone]           Next level of care provider has access to [facility] Link:no  Safety Planning and Suicide Prevention discussed: Yes,     Have you used any form of tobacco in the last 30 days? (Cigarettes, Smokeless Tobacco, Cigars, and/or Pipes): Yes  Has patient been referred to the Quitline?: N/A patient is not a smoker  Patient has been referred for addiction treatment: Yes  Glennon MacSara P Suzan Manon, MSW, LCSW 06/24/2016, 9:33 AM

## 2017-12-10 ENCOUNTER — Encounter: Payer: Self-pay | Admitting: Emergency Medicine

## 2017-12-10 ENCOUNTER — Other Ambulatory Visit: Payer: Self-pay

## 2017-12-10 DIAGNOSIS — J449 Chronic obstructive pulmonary disease, unspecified: Secondary | ICD-10-CM | POA: Insufficient documentation

## 2017-12-10 DIAGNOSIS — J45909 Unspecified asthma, uncomplicated: Secondary | ICD-10-CM | POA: Insufficient documentation

## 2017-12-10 DIAGNOSIS — K219 Gastro-esophageal reflux disease without esophagitis: Secondary | ICD-10-CM | POA: Insufficient documentation

## 2017-12-10 DIAGNOSIS — I10 Essential (primary) hypertension: Secondary | ICD-10-CM | POA: Insufficient documentation

## 2017-12-10 DIAGNOSIS — Z79899 Other long term (current) drug therapy: Secondary | ICD-10-CM | POA: Insufficient documentation

## 2017-12-10 DIAGNOSIS — M79644 Pain in right finger(s): Secondary | ICD-10-CM | POA: Insufficient documentation

## 2017-12-10 DIAGNOSIS — W5911XA Bitten by nonvenomous snake, initial encounter: Secondary | ICD-10-CM | POA: Insufficient documentation

## 2017-12-10 DIAGNOSIS — F17200 Nicotine dependence, unspecified, uncomplicated: Secondary | ICD-10-CM | POA: Insufficient documentation

## 2017-12-10 LAB — CBC WITH DIFFERENTIAL/PLATELET
BASOS PCT: 1 %
Basophils Absolute: 0.1 10*3/uL (ref 0–0.1)
EOS ABS: 0.4 10*3/uL (ref 0–0.7)
Eosinophils Relative: 5 %
HCT: 44.6 % (ref 40.0–52.0)
HEMOGLOBIN: 14.9 g/dL (ref 13.0–18.0)
Lymphocytes Relative: 28 %
Lymphs Abs: 2.3 10*3/uL (ref 1.0–3.6)
MCH: 30.1 pg (ref 26.0–34.0)
MCHC: 33.4 g/dL (ref 32.0–36.0)
MCV: 90.2 fL (ref 80.0–100.0)
MONOS PCT: 9 %
Monocytes Absolute: 0.8 10*3/uL (ref 0.2–1.0)
NEUTROS PCT: 57 %
Neutro Abs: 4.7 10*3/uL (ref 1.4–6.5)
Platelets: 329 10*3/uL (ref 150–440)
RBC: 4.94 MIL/uL (ref 4.40–5.90)
RDW: 12.9 % (ref 11.5–14.5)
WBC: 8.3 10*3/uL (ref 3.8–10.6)

## 2017-12-10 LAB — COMPREHENSIVE METABOLIC PANEL
ALBUMIN: 3.5 g/dL (ref 3.5–5.0)
ALT: 8 U/L — ABNORMAL LOW (ref 17–63)
ANION GAP: 9 (ref 5–15)
AST: 22 U/L (ref 15–41)
Alkaline Phosphatase: 43 U/L (ref 38–126)
BUN: 12 mg/dL (ref 6–20)
CO2: 22 mmol/L (ref 22–32)
Calcium: 8.7 mg/dL — ABNORMAL LOW (ref 8.9–10.3)
Chloride: 108 mmol/L (ref 101–111)
Creatinine, Ser: 1.13 mg/dL (ref 0.61–1.24)
GFR calc Af Amer: >60 mL/min (ref >60)
GFR calc non Af Amer: >60 mL/min (ref >60)
GLUCOSE: 79 mg/dL (ref 65–99)
Potassium: 3.7 mmol/L (ref 3.5–5.1)
SODIUM: 139 mmol/L (ref 135–145)
Total Bilirubin: 0.6 mg/dL (ref 0.3–1.2)
Total Protein: 5.8 g/dL — ABNORMAL LOW (ref 6.5–8.1)

## 2017-12-10 LAB — PROTIME-INR
INR: 0.89
Prothrombin Time: 12 seconds (ref 11.4–15.2)

## 2017-12-10 NOTE — ED Notes (Signed)
Spoke with Dr Manson PasseyBrown regarding pt's presenting c/o; orders obtained

## 2017-12-10 NOTE — ED Triage Notes (Signed)
Patient ambulatory to triage with steady gait, without difficulty or distress noted; pt reports N/V x 2 days and generalized abd pain; st bite to right thumb by a black snake 3 days ago; no swelling or redness noted

## 2017-12-11 ENCOUNTER — Emergency Department
Admission: EM | Admit: 2017-12-11 | Discharge: 2017-12-11 | Disposition: A | Discharge status: Home / Self Care | Payer: Self-pay | Attending: Emergency Medicine | Admitting: Emergency Medicine

## 2017-12-11 DIAGNOSIS — W5911XA Bitten by nonvenomous snake, initial encounter: Secondary | ICD-10-CM

## 2017-12-11 DIAGNOSIS — K219 Gastro-esophageal reflux disease without esophagitis: Secondary | ICD-10-CM

## 2017-12-11 LAB — URINALYSIS, COMPLETE (UACMP) WITH MICROSCOPIC
Bacteria, UA: NONE SEEN
Bilirubin Urine: NEGATIVE
GLUCOSE, UA: NEGATIVE mg/dL
Hgb urine dipstick: NEGATIVE
Ketones, ur: NEGATIVE mg/dL
Leukocytes, UA: NEGATIVE
Nitrite: NEGATIVE
PH: 5 (ref 5.0–8.0)
Protein, ur: NEGATIVE mg/dL
SPECIFIC GRAVITY, URINE: 1.02 (ref 1.005–1.030)
SQUAMOUS EPITHELIAL / LPF: NONE SEEN

## 2017-12-11 LAB — LIPASE, BLOOD: Lipase: 35 U/L (ref 11–51)

## 2017-12-11 MED ORDER — SODIUM CHLORIDE 0.9 % IV BOLUS (SEPSIS)
1000.0000 mL | Freq: Once | INTRAVENOUS | Status: AC
  Administered 2017-12-11: 1000 mL via INTRAVENOUS

## 2017-12-11 MED ORDER — ONDANSETRON 4 MG PO TBDP: 4.0000 mg | ORAL_TABLET | Freq: Three times a day (TID) | ORAL | 0 refills | Status: AC | PRN

## 2017-12-11 MED ORDER — FAMOTIDINE 20 MG PO TABS: 20.0000 mg | ORAL_TABLET | Freq: Two times a day (BID) | ORAL | 0 refills | Status: DC

## 2017-12-11 MED ORDER — ONDANSETRON HCL 4 MG/2ML IJ SOLN
4.0000 mg | Freq: Once | INTRAMUSCULAR | Status: AC
  Administered 2017-12-11: 4 mg via INTRAVENOUS
  Filled 2017-12-11: qty 2

## 2017-12-11 MED ORDER — FAMOTIDINE IN NACL 20-0.9 MG/50ML-% IV SOLN
20.0000 mg | Freq: Once | INTRAVENOUS | Status: AC
  Administered 2017-12-11: 20 mg via INTRAVENOUS
  Filled 2017-12-11: qty 50

## 2017-12-11 MED ORDER — SUCRALFATE 1 GM/10ML PO SUSP: 1.0000 g | Freq: Four times a day (QID) | ORAL | 1 refills | Status: DC

## 2017-12-11 NOTE — ED Provider Notes (Addendum)
[facility] Emergency Department Provider Note   ____________________________________________   First MD Initiated Contact with Patient 12/11/17 [phone]     (approximate)  I have reviewed the triage vital signs and the nursing notes.   HISTORY  Chief Complaint Snake Bite    HPI Christopher Christensen is a 22 y.o. male who presents to the ED from home with a chief complaint of epigastric burning discomfort, nausea/vomiting, and snakebite.  Patient reports history of GERD but does not take medicines; also left spicy food who has been having epigastric burning for the past 2 days associated with nausea and vomiting.  Incidentally he was bit on his right thumb by a black snake 3 days ago.  Does not complain of thumb pain, swelling or redness.  He clearly saw a snake and is confident it was a black, nonvenomous snake.  Tells me that "more people die from nonvenomous snake bites then venomous snake bites because they carry bacteria in their veins".  Concerned that his epigastric discomfort is from the "snake bacteria".  Denies associated fever, chills, chest pain, shortness of breath, dysuria, diarrhea.  Denies recent travel.   Past Medical History:  Diagnosis Date  . ADHD (attention deficit hyperactivity disorder)   . Anxiety   . Asthma   . Bipolar disorder (HCC)   . COPD (chronic obstructive pulmonary disease) (HCC)   . Depression   . Dyslexia   . Hypertension   . PTSD (post-traumatic stress disorder)   . Renal disorder     Patient Active Problem List   Diagnosis Date Noted  . Adjustment disorder with depressed mood 06/22/2016  . Cocaine use disorder, moderate, dependence (HCC) 06/22/2016  . Cannabis use disorder, moderate, dependence (HCC) 06/22/2016  . Opioid use disorder, moderate, dependence (HCC) 06/22/2016  . Substance induced mood disorder (HCC) 06/22/2016  . Tobacco use disorder 06/22/2016    History reviewed. No pertinent surgical history.  Prior  to Admission medications   Medication Sig Start Date End Date Taking? Authorizing Provider  albuterol (PROVENTIL HFA;VENTOLIN HFA) 108 (90 Base) MCG/ACT inhaler Inhale 2 puffs into the lungs every 6 (six) hours as needed for wheezing or shortness of breath. 06/23/16   Pucilowska, Braulio Conte B, MD  citalopram (CELEXA) 20 MG tablet Take 1 tablet (20 mg total) by mouth daily. 06/24/16   Pucilowska, Jolanta B, MD  diphenhydramine-acetaminophen (TYLENOL PM) 25-500 MG TABS tablet Take 2 tablets by mouth at bedtime as needed (sleep).    [provider]  famotidine (PEPCID) 20 MG tablet Take 1 tablet (20 mg total) by mouth 2 (two) times daily. 12/11/17   Irean Hong, MD  ondansetron (ZOFRAN ODT) 4 MG disintegrating tablet Take 1 tablet (4 mg total) by mouth every 8 (eight) hours as needed for nausea or vomiting. 12/11/17   Irean Hong, MD  sucralfate (CARAFATE) 1 GM/10ML suspension Take 10 mLs (1 g total) by mouth 4 (four) times daily. 12/11/17   Irean Hong, MD    Allergies Patient has no known allergies.  No family history on file.  Social History Social History   Tobacco Use  . Smoking status: Current Every Day Smoker  . Smokeless tobacco: Never Used  Substance Use Topics  . Alcohol use: Yes  . Drug use: Not on file    Review of Systems  Constitutional: No fever/chills. Eyes: No visual changes. ENT: No sore throat. Cardiovascular: Denies chest pain. Respiratory: Denies shortness of breath. Gastrointestinal: Positive for epigastric burning discomfort, nausea  and vomiting.  No diarrhea.  No constipation. Genitourinary: Negative for dysuria. Musculoskeletal: Positive for snake bite to right thumb.  Negative for back pain. Skin: Negative for rash. Neurological: Negative for headaches, focal weakness or numbness.   ____________________________________________   PHYSICAL EXAM:  VITAL SIGNS: ED Triage Vitals  Enc Vitals Group     BP 12/11/17 0157 (!) 97/55     Pulse Rate  12/11/17 0157 88     Resp 12/11/17 0157 18     Temp --      Temp src --      SpO2 12/11/17 0157 100 %     Weight 12/10/17 2259 154 lb (69.9 kg)     Height 12/10/17 2259 5\' 9"  (1.753 m)     Head Circumference --      Peak Flow --      Pain Score 12/10/17 2259 10     Pain Loc --      Pain Edu? --      Excl. in GC? --     Constitutional: Asleep, awakened for exam.  Alert and oriented. Well appearing and in no acute distress. Eyes: Conjunctivae are normal. PERRL. EOMI. Head: Atraumatic. Nose: No congestion/rhinnorhea. Mouth/Throat: Mucous membranes are moist.  Oropharynx non-erythematous. Neck: No stridor.   Cardiovascular: Normal rate, regular rhythm. Grossly normal heart sounds.  Good peripheral circulation. Respiratory: Normal respiratory effort.  No retractions. Lungs CTAB. Gastrointestinal: Soft and mildly tender tender to palpation epigastrium without rebound or guarding. No distention. No abdominal bruits. No CVA tenderness. Musculoskeletal:  Right thumb at site of snakebite without swelling, redness or warmth.  Full range of motion without pain.  2+ radial pulses.  Brisk, less than 5-second capillary refill.  No lower extremity tenderness nor edema.  No joint effusions. Neurologic:  Normal speech and language. No gross focal neurologic deficits are appreciated. No gait instability. Skin:  Skin is warm, dry and intact. No rash noted. Psychiatric: Mood and affect are normal. Speech and behavior are normal.  ____________________________________________   LABS (all labs ordered are listed, but only abnormal results are displayed)  Labs Reviewed  COMPREHENSIVE METABOLIC PANEL - Abnormal; Notable for the following components:      Result Value   Calcium 8.7 (*)    Total Protein 5.8 (*)    ALT 8 (*)    All other components within normal limits  URINALYSIS, COMPLETE (UACMP) WITH MICROSCOPIC - Abnormal; Notable for the following components:   Color, Urine YELLOW (*)     APPearance HAZY (*)    All other components within normal limits  CBC WITH DIFFERENTIAL/PLATELET  PROTIME-INR  LIPASE, BLOOD   ____________________________________________  EKG  None ____________________________________________  RADIOLOGY  ED MD interpretation: None  Official radiology report(s): No results found.  ____________________________________________   PROCEDURES  Procedure(s) performed: None  Procedures  Critical Care performed: No  ____________________________________________   INITIAL IMPRESSION / ASSESSMENT AND PLAN / ED COURSE  As part of my medical decision making, I reviewed the following data within the electronic MEDICAL RECORD NUMBER History obtained from family, Nursing notes reviewed and incorporated, Labs reviewed, Old chart reviewed and Notes from prior ED visits   22 year old male with hypertension, psychiatric disorder, substance use disorder who presents with epigastric burning discomfort, nausea and vomiting. Differential diagnosis includes, but is not limited to, biliary disease (biliary colic, acute cholecystitis, cholangitis, choledocholithiasis, etc), intrathoracic causes for epigastric abdominal pain including ACS, gastritis, duodenitis, pancreatitis, small bowel or large bowel obstruction, abdominal aortic aneurysm, hernia, and  gastritis.  Laboratory and urinalysis results unremarkable.  Will add lipase.  Will administer IV fluids, Zofran and Pepcid. Patient afebrile.  Clinical Course as of Dec 12 651  Fri Dec 11, 2017  0628 No emesis, sleeping no acute distress.  Tolerated liquids without vomiting.  Will discharge home on Pepcid and refer to GI for outpatient follow-up.  Strict return precautions given.  Patient verbalizes understanding and agrees with plan of care.  [JS]    Clinical Course User Index [JS] Irean Hong, MD     ____________________________________________   FINAL CLINICAL IMPRESSION(S) / ED DIAGNOSES  Final  diagnoses:  Gastroesophageal reflux disease, esophagitis presence not specified  Snake bite, initial encounter     ED Discharge Orders        Ordered    sucralfate (CARAFATE) 1 GM/10ML suspension  4 times daily     12/11/17 0631    famotidine (PEPCID) 20 MG tablet  2 times daily     12/11/17 0631    ondansetron (ZOFRAN ODT) 4 MG disintegrating tablet  Every 8 hours PRN     12/11/17 0631       Note:  This document was prepared using Dragon voice recognition software and may include unintentional dictation errors.    Irean Hong, MD 12/11/17 1610    Irean Hong, MD 12/11/17 [phone]

## 2017-12-11 NOTE — Discharge Instructions (Signed)
1.  Start Pepcid 20 mg twice daily (#60). 2.  Take Carafate before meals and at bedtime. 3.  Avoid greasy, spicy foods and alcohol. 4.  You may take Zofran as needed for nausea/vomiting. 5.  Return to the ER for worsening symptoms, persistent vomiting, difficulty breathing or other concerns.

## 2017-12-31 ENCOUNTER — Emergency Department: Payer: Self-pay

## 2017-12-31 ENCOUNTER — Observation Stay
Admission: EM | Admit: 2017-12-31 | Discharge: 2018-01-02 | Disposition: A | Discharge status: Home / Self Care | Payer: Self-pay | Attending: Internal Medicine | Admitting: Internal Medicine

## 2017-12-31 ENCOUNTER — Encounter: Payer: Self-pay | Admitting: Emergency Medicine

## 2017-12-31 DIAGNOSIS — K254 Chronic or unspecified gastric ulcer with hemorrhage: Principal | ICD-10-CM | POA: Insufficient documentation

## 2017-12-31 DIAGNOSIS — K920 Hematemesis: Secondary | ICD-10-CM | POA: Diagnosis present

## 2017-12-31 DIAGNOSIS — K2950 Unspecified chronic gastritis without bleeding: Secondary | ICD-10-CM | POA: Insufficient documentation

## 2017-12-31 DIAGNOSIS — J449 Chronic obstructive pulmonary disease, unspecified: Secondary | ICD-10-CM | POA: Insufficient documentation

## 2017-12-31 DIAGNOSIS — F4321 Adjustment disorder with depressed mood: Secondary | ICD-10-CM | POA: Insufficient documentation

## 2017-12-31 DIAGNOSIS — T39395A Adverse effect of other nonsteroidal anti-inflammatory drugs [NSAID], initial encounter: Secondary | ICD-10-CM

## 2017-12-31 DIAGNOSIS — Z885 Allergy status to narcotic agent status: Secondary | ICD-10-CM | POA: Insufficient documentation

## 2017-12-31 DIAGNOSIS — Z79899 Other long term (current) drug therapy: Secondary | ICD-10-CM | POA: Insufficient documentation

## 2017-12-31 DIAGNOSIS — K449 Diaphragmatic hernia without obstruction or gangrene: Secondary | ICD-10-CM | POA: Insufficient documentation

## 2017-12-31 DIAGNOSIS — F909 Attention-deficit hyperactivity disorder, unspecified type: Secondary | ICD-10-CM | POA: Insufficient documentation

## 2017-12-31 DIAGNOSIS — I10 Essential (primary) hypertension: Secondary | ICD-10-CM | POA: Insufficient documentation

## 2017-12-31 DIAGNOSIS — F4310 Post-traumatic stress disorder, unspecified: Secondary | ICD-10-CM | POA: Insufficient documentation

## 2017-12-31 DIAGNOSIS — F319 Bipolar disorder, unspecified: Secondary | ICD-10-CM | POA: Insufficient documentation

## 2017-12-31 DIAGNOSIS — K2960 Other gastritis without bleeding: Secondary | ICD-10-CM

## 2017-12-31 DIAGNOSIS — F17210 Nicotine dependence, cigarettes, uncomplicated: Secondary | ICD-10-CM | POA: Insufficient documentation

## 2017-12-31 LAB — CBC
HEMATOCRIT: 46.6 % (ref 40.0–52.0)
HEMOGLOBIN: 16.2 g/dL (ref 13.0–18.0)
MCH: 31.1 pg (ref 26.0–34.0)
MCHC: 34.8 g/dL (ref 32.0–36.0)
MCV: 89.3 fL (ref 80.0–100.0)
Platelets: 310 10*3/uL (ref 150–440)
RBC: 5.22 MIL/uL (ref 4.40–5.90)
RDW: 12.8 % (ref 11.5–14.5)
WBC: 17.2 10*3/uL — AB (ref 3.8–10.6)

## 2017-12-31 LAB — COMPREHENSIVE METABOLIC PANEL
ALBUMIN: 3.7 g/dL (ref 3.5–5.0)
ALT: 10 U/L — ABNORMAL LOW (ref 17–63)
ANION GAP: 6 (ref 5–15)
AST: 24 U/L (ref 15–41)
Alkaline Phosphatase: 42 U/L (ref 38–126)
BILIRUBIN TOTAL: 0.7 mg/dL (ref 0.3–1.2)
BUN: 13 mg/dL (ref 6–20)
CO2: 28 mmol/L (ref 22–32)
Calcium: 9 mg/dL (ref 8.9–10.3)
Chloride: 106 mmol/L (ref 101–111)
Creatinine, Ser: 1.09 mg/dL (ref 0.61–1.24)
GFR calc non Af Amer: >60 mL/min (ref >60)
GLUCOSE: 111 mg/dL — AB (ref 65–99)
POTASSIUM: 3.5 mmol/L (ref 3.5–5.1)
Sodium: 140 mmol/L (ref 135–145)
TOTAL PROTEIN: 6 g/dL — AB (ref 6.5–8.1)

## 2017-12-31 LAB — HEMOGLOBIN AND HEMATOCRIT, BLOOD
HEMATOCRIT: 46.3 % (ref 40.0–52.0)
HEMOGLOBIN: 15.5 g/dL (ref 13.0–18.0)

## 2017-12-31 LAB — LIPASE, BLOOD: Lipase: 27 U/L (ref 11–51)

## 2017-12-31 MED ORDER — FAMOTIDINE 20 MG PO TABS: 20.0000 mg | ORAL_TABLET | Freq: Two times a day (BID) | ORAL | 1 refills | Status: DC

## 2017-12-31 MED ORDER — ONDANSETRON 4 MG PO TBDP: 4.0000 mg | ORAL_TABLET | Freq: Three times a day (TID) | ORAL | 0 refills | Status: DC | PRN

## 2017-12-31 MED ORDER — ONDANSETRON HCL 4 MG/2ML IJ SOLN
4.0000 mg | Freq: Once | INTRAMUSCULAR | Status: AC
  Administered 2017-12-31: 4 mg via INTRAVENOUS
  Filled 2017-12-31: qty 2

## 2017-12-31 MED ORDER — PANTOPRAZOLE SODIUM 40 MG IV SOLR
40.0000 mg | Freq: Once | INTRAVENOUS | Status: AC
  Administered 2017-12-31: 40 mg via INTRAVENOUS
  Filled 2017-12-31: qty 40

## 2017-12-31 MED ORDER — SODIUM CHLORIDE 0.9 % IV BOLUS: 1000.0000 mL | Freq: Once | INTRAVENOUS | Status: DC

## 2017-12-31 NOTE — ED Provider Notes (Signed)
[facility] Emergency Department Provider Note  ____________________________________________  Time seen: Approximately 11:20 PM  I have reviewed the triage vital signs and the nursing notes.   HISTORY  Chief Complaint Hematemesis and Abdominal Pain   HPI Christopher Christensen is a 22 y.o. male with a history of polysubstance abuse, bipolar disorder, gastritis, hypertension who presents for evaluation of hematemesis.  Patient reports 6 episodes of hematemesis.  This is the second time he has similar presentation.  Was discharged on a PPI however has not been able to afford it.  He reports that he goes through a box of 6 BC powder in an hour almost every day for chronic neck pain.  He also drinks alcohol occasionally.  Last drink was 2 days ago.  He reports some clots with the hematemesis.  He denies coffee-ground emesis.  He reports one bowel movement today which looked slightly black.  He denies any blood thinners.  He has never needed a transfusion.  Is also complaining of mild constant burning epigastric pain that has been present since this morning.  Last episode of vomiting was 1 hour prior to arrival at [facility].   Past Medical History:  Diagnosis Date  . ADHD (attention deficit hyperactivity disorder)   . Anxiety   . Asthma   . Bipolar disorder (HCC)   . COPD (chronic obstructive pulmonary disease) (HCC)   . Depression   . Dyslexia   . Hypertension   . PTSD (post-traumatic stress disorder)   . Renal disorder     Patient Active Problem List   Diagnosis Date Noted  . Adjustment disorder with depressed mood 06/22/2016  . Cocaine use disorder, moderate, dependence (HCC) 06/22/2016  . Cannabis use disorder, moderate, dependence (HCC) 06/22/2016  . Opioid use disorder, moderate, dependence (HCC) 06/22/2016  . Substance induced mood disorder (HCC) 06/22/2016  . Tobacco use disorder 06/22/2016    History reviewed. No pertinent surgical history.  Prior to  Admission medications   Medication Sig Start Date End Date Taking? Authorizing Provider  albuterol (PROVENTIL HFA;VENTOLIN HFA) 108 (90 Base) MCG/ACT inhaler Inhale 2 puffs into the lungs every 6 (six) hours as needed for wheezing or shortness of breath. 06/23/16   Pucilowska, Braulio Conte B, MD  citalopram (CELEXA) 20 MG tablet Take 1 tablet (20 mg total) by mouth daily. 06/24/16   Pucilowska, Jolanta B, MD  diphenhydramine-acetaminophen (TYLENOL PM) 25-500 MG TABS tablet Take 2 tablets by mouth at bedtime as needed (sleep).    [provider]  famotidine (PEPCID) 20 MG tablet Take 1 tablet (20 mg total) by mouth 2 (two) times daily. 12/11/17   Irean Hong, MD  famotidine (PEPCID) 20 MG tablet Take 1 tablet (20 mg total) by mouth 2 (two) times daily. 12/31/17 12/31/18  Nita Sickle, MD  ondansetron (ZOFRAN ODT) 4 MG disintegrating tablet Take 1 tablet (4 mg total) by mouth every 8 (eight) hours as needed for nausea or vomiting. 12/11/17   Irean Hong, MD  ondansetron (ZOFRAN ODT) 4 MG disintegrating tablet Take 1 tablet (4 mg total) by mouth every 8 (eight) hours as needed for nausea or vomiting. 12/31/17   Nita Sickle, MD  sucralfate (CARAFATE) 1 GM/10ML suspension Take 10 mLs (1 g total) by mouth 4 (four) times daily. 12/11/17   Irean Hong, MD    Allergies Fentanyl  No family history on file.  Social History Social History   Tobacco Use  . Smoking status: Current Every Day Smoker  .  Smokeless tobacco: Never Used  Substance Use Topics  . Alcohol use: Yes  . Drug use: Not on file    Review of Systems  Constitutional: Negative for fever. Eyes: Negative for visual changes. ENT: Negative for sore throat. Neck: No neck pain  Cardiovascular: Negative for chest pain. Respiratory: Negative for shortness of breath. Gastrointestinal: + epigastric abdominal pain, hematemesis, black stool. Genitourinary: Negative for dysuria. Musculoskeletal: Negative for back pain. Skin:  Negative for rash. Neurological: Negative for headaches, weakness or numbness. Psych: No SI or HI  ____________________________________________   PHYSICAL EXAM:  VITAL SIGNS: ED Triage Vitals  Enc Vitals Group     BP 12/31/17 2008 125/78     Pulse Rate 12/31/17 2008 95     Resp 12/31/17 2008 18     Temp 12/31/17 2008 98.7 F (37.1 C)     Temp Source 12/31/17 2008 Oral     SpO2 12/31/17 2008 96 %     Weight --      Height --      Head Circumference --      Peak Flow --      Pain Score 12/31/17 2009 9     Pain Loc --      Pain Edu? --      Excl. in GC? --     Constitutional: Alert and oriented. Well appearing and in no apparent distress. HEENT:      Head: Normocephalic and atraumatic.         Eyes: Conjunctivae are normal. Sclera is non-icteric.       Mouth/Throat: Mucous membranes are moist.       Neck: Supple with no signs of meningismus. Cardiovascular: Regular rate and rhythm. No murmurs, gallops, or rubs. 2+ symmetrical distal pulses are present in all extremities. No JVD. Respiratory: Normal respiratory effort. Lungs are clear to auscultation bilaterally. No wheezes, crackles, or rhonchi.  Gastrointestinal: Soft, mild epigastric tenderness, and non distended with positive bowel sounds. No rebound or guarding. Genitourinary: No CVA tenderness.  Rectal exam showing light brown stool guaiac negative Musculoskeletal: Nontender with normal range of motion in all extremities. No edema, cyanosis, or erythema of extremities. Neurologic: Normal speech and language. Face is symmetric. Moving all extremities. No gross focal neurologic deficits are appreciated. Skin: Skin is warm, dry and intact. No rash noted. Psychiatric: Mood and affect are normal. Speech and behavior are normal.  ____________________________________________   LABS (all labs ordered are listed, but only abnormal results are displayed)  Labs Reviewed  COMPREHENSIVE METABOLIC PANEL - Abnormal; Notable for  the following components:      Result Value   Glucose, Bld 111 (*)    Total Protein 6.0 (*)    ALT 10 (*)    All other components within normal limits  CBC - Abnormal; Notable for the following components:   WBC 17.2 (*)    All other components within normal limits  LIPASE, BLOOD  HEMOGLOBIN AND HEMATOCRIT, BLOOD  URINALYSIS, COMPLETE (UACMP) WITH MICROSCOPIC  HEMOGLOBIN AND HEMATOCRIT, BLOOD   ____________________________________________  EKG  ED ECG REPORT I, Nita Sickle, the attending physician, personally viewed and interpreted this ECG.  Normal sinus rhythm, rate of 90, incomplete right bundle branch block, normal QTC, normal axis, no ST elevations or depressions.  No prior for comparison   __________________________________________  RADIOLOGY  KUB: PND  ____________________________________________   PROCEDURES  Procedure(s) performed: None Procedures Critical Care performed:  None ____________________________________________   INITIAL IMPRESSION / ASSESSMENT AND PLAN / ED COURSE  22 y.o. male with a history of polysubstance abuse, bipolar disorder, gastritis, hypertension who presents for evaluation of hematemesis x6.  Patient uses large amounts of NSAIDs daily.  Rectal exam showing light brown stool guaiac negative.  Last episode of hematemesis was a 7 PM.  No episodes in the emergency room.  Patient is hemodynamically stable.  Abdomen with mild epigastric tenderness, no rebound or guarding.  Clinically no concerns for perforated ulcer.  Hemoglobin within normal limits.  Will repeat in q 4hours. Will get KUB to eval for free air. Patient given protonix and zofran. If H&H stable plan to discharge home on Pepcid and referred to GI. Counseling provided about NSAID cessation. Care transferred to Dr. Manson PasseyBrown.      As part of my medical decision making, I reviewed the following data within the electronic MEDICAL RECORD NUMBER Nursing notes reviewed and incorporated,  Labs reviewed , EKG interpreted  Notes from prior ED visits and [place]    Pertinent labs & imaging results that were available during my care of the patient were reviewed by me and considered in my medical decision making (see chart for details).    ____________________________________________   FINAL CLINICAL IMPRESSION(S) / ED DIAGNOSES  Final diagnoses:  Hematemesis with nausea  Gastritis due to nonsteroidal anti-inflammatory drug (NSAID)      NEW MEDICATIONS STARTED DURING THIS VISIT:  ED Discharge Orders        Ordered    famotidine (PEPCID) 20 MG tablet  2 times daily     12/31/17 2328    ondansetron (ZOFRAN ODT) 4 MG disintegrating tablet  Every 8 hours PRN     12/31/17 2328       Note:  This document was prepared using Dragon voice recognition software and may include unintentional dictation errors.    Nita SickleVeronese, [place], MD 12/31/17 2329

## 2017-12-31 NOTE — Discharge Instructions (Addendum)
Do not take NSAIDs. Avoid alcohol. Take tylenol for pain if needed. Take pepcid for at least 2 weeks to help your stomach heal. Follow up with GI.  Return to the emergency room if you have any further episodes of vomiting blood or black stools.

## 2017-12-31 NOTE — ED Triage Notes (Signed)
Patient states he was seen here a couple of weeks ago and told he had gastritis and was vomiting blood today x 5 times.  He states he was told by MD at that time if he had returning pains to come back here.  Pt states the last time he vomited anything was 2 hours ago.

## 2018-01-01 ENCOUNTER — Encounter
Admission: EM | Disposition: A | Discharge status: Home / Self Care | Payer: Self-pay | Source: Home / Self Care | Attending: Emergency Medicine

## 2018-01-01 ENCOUNTER — Encounter: Payer: Self-pay | Admitting: Radiology

## 2018-01-01 ENCOUNTER — Observation Stay: Payer: Self-pay | Admitting: Anesthesiology

## 2018-01-01 ENCOUNTER — Other Ambulatory Visit: Payer: Self-pay

## 2018-01-01 ENCOUNTER — Emergency Department: Payer: Self-pay

## 2018-01-01 DIAGNOSIS — F4321 Adjustment disorder with depressed mood: Secondary | ICD-10-CM

## 2018-01-01 DIAGNOSIS — K920 Hematemesis: Secondary | ICD-10-CM | POA: Diagnosis present

## 2018-01-01 HISTORY — PX: ESOPHAGOGASTRODUODENOSCOPY (EGD) WITH PROPOFOL: SHX5813

## 2018-01-01 LAB — URINALYSIS, COMPLETE (UACMP) WITH MICROSCOPIC
BILIRUBIN URINE: NEGATIVE
Bacteria, UA: NONE SEEN
Glucose, UA: NEGATIVE mg/dL
Ketones, ur: NEGATIVE mg/dL
Leukocytes, UA: NEGATIVE
NITRITE: NEGATIVE
PROTEIN: NEGATIVE mg/dL
RBC / HPF: NONE SEEN RBC/hpf (ref 0–5)
SPECIFIC GRAVITY, URINE: 1.01 (ref 1.005–1.030)
Squamous Epithelial / LPF: NONE SEEN
pH: 8 (ref 5.0–8.0)

## 2018-01-01 LAB — HEMOGLOBIN A1C
Hgb A1c MFr Bld: 5.1 % (ref 4.8–5.6)
Mean Plasma Glucose: 99.67 mg/dL

## 2018-01-01 LAB — URINE DRUG SCREEN, QUALITATIVE (ARMC ONLY)
AMPHETAMINES, UR SCREEN: NOT DETECTED
Barbiturates, Ur Screen: NOT DETECTED
Benzodiazepine, Ur Scrn: NOT DETECTED
Cannabinoid 50 Ng, Ur ~~LOC~~: NOT DETECTED
Cocaine Metabolite,Ur ~~LOC~~: NOT DETECTED
MDMA (ECSTASY) UR SCREEN: NOT DETECTED
METHADONE SCREEN, URINE: NOT DETECTED
Opiate, Ur Screen: NOT DETECTED
Phencyclidine (PCP) Ur S: NOT DETECTED
TRICYCLIC, UR SCREEN: NOT DETECTED

## 2018-01-01 LAB — HEMOGLOBIN AND HEMATOCRIT, BLOOD
HCT: 41.8 % (ref 40.0–52.0)
HEMOGLOBIN: 14 g/dL (ref 13.0–18.0)

## 2018-01-01 LAB — TSH: TSH: 2.045 u[IU]/mL (ref 0.350–4.500)

## 2018-01-01 SURGERY — ESOPHAGOGASTRODUODENOSCOPY (EGD) WITH PROPOFOL
Anesthesia: General

## 2018-01-01 MED ORDER — ONDANSETRON HCL 4 MG/2ML IJ SOLN: 4.0000 mg | Freq: Four times a day (QID) | INTRAMUSCULAR | Status: DC | PRN

## 2018-01-01 MED ORDER — PANTOPRAZOLE SODIUM 40 MG IV SOLR: 40.0000 mg | Freq: Once | INTRAVENOUS | Status: DC

## 2018-01-01 MED ORDER — SODIUM CHLORIDE 0.9 % IV SOLN
8.0000 mg/h | INTRAVENOUS | Status: DC
  Administered 2018-01-01 (×2): 8 mg/h via INTRAVENOUS
  Filled 2018-01-01 (×2): qty 80

## 2018-01-01 MED ORDER — PANTOPRAZOLE SODIUM 40 MG PO TBEC
40.0000 mg | DELAYED_RELEASE_TABLET | Freq: Every day | ORAL | Status: DC
  Administered 2018-01-02: 11:00:00 40 mg via ORAL
  Filled 2018-01-01: qty 1

## 2018-01-01 MED ORDER — PROPOFOL 500 MG/50ML IV EMUL
INTRAVENOUS | Status: DC | PRN
  Administered 2018-01-01: 120 ug/kg/min via INTRAVENOUS

## 2018-01-01 MED ORDER — ACETAMINOPHEN 650 MG RE SUPP: 650.0000 mg | Freq: Four times a day (QID) | RECTAL | Status: DC | PRN

## 2018-01-01 MED ORDER — GLYCOPYRROLATE 0.2 MG/ML IJ SOLN
INTRAMUSCULAR | Status: AC
  Filled 2018-01-01: qty 1

## 2018-01-01 MED ORDER — PANTOPRAZOLE SODIUM 40 MG IV SOLR: 40.0000 mg | Freq: Two times a day (BID) | INTRAVENOUS | Status: DC

## 2018-01-01 MED ORDER — PROPOFOL 10 MG/ML IV BOLUS
INTRAVENOUS | Status: DC | PRN
  Administered 2018-01-01: 100 mg via INTRAVENOUS

## 2018-01-01 MED ORDER — PROPOFOL 500 MG/50ML IV EMUL
INTRAVENOUS | Status: AC
  Filled 2018-01-01: qty 50

## 2018-01-01 MED ORDER — ONDANSETRON HCL 4 MG PO TABS: 4.0000 mg | ORAL_TABLET | Freq: Four times a day (QID) | ORAL | Status: DC | PRN

## 2018-01-01 MED ORDER — SODIUM CHLORIDE 0.9 % IV SOLN
80.0000 mg | Freq: Once | INTRAVENOUS | Status: AC
  Administered 2018-01-01: 04:00:00 80 mg via INTRAVENOUS
  Filled 2018-01-01: qty 80

## 2018-01-01 MED ORDER — GLYCOPYRROLATE 0.2 MG/ML IJ SOLN
INTRAMUSCULAR | Status: DC | PRN
  Administered 2018-01-01: 0.2 mg via INTRAVENOUS

## 2018-01-01 MED ORDER — LIDOCAINE HCL (PF) 2 % IJ SOLN
INTRAMUSCULAR | Status: AC
  Filled 2018-01-01: qty 10

## 2018-01-01 MED ORDER — ERYTHROMYCIN 5 MG/GM OP OINT
1.0000 "application " | TOPICAL_OINTMENT | Freq: Four times a day (QID) | OPHTHALMIC | Status: DC
  Administered 2018-01-01 (×3): 1 via OPHTHALMIC
  Filled 2018-01-01: qty 3.5

## 2018-01-01 MED ORDER — ACETAMINOPHEN 325 MG PO TABS: 650.0000 mg | ORAL_TABLET | Freq: Four times a day (QID) | ORAL | Status: DC | PRN

## 2018-01-01 MED ORDER — SODIUM CHLORIDE 0.9 % IV SOLN: INTRAVENOUS | Status: DC

## 2018-01-01 MED ORDER — IOPAMIDOL (ISOVUE-300) INJECTION 61%
100.0000 mL | Freq: Once | INTRAVENOUS | Status: AC | PRN
  Administered 2018-01-01: 100 mL via INTRAVENOUS

## 2018-01-01 MED ORDER — SODIUM CHLORIDE 0.9 % IV SOLN
INTRAVENOUS | Status: DC
  Administered 2018-01-01 (×2): via INTRAVENOUS

## 2018-01-01 NOTE — Progress Notes (Signed)
Nutrition Brief Note  Patient identified on the Malnutrition Screening Tool (MST) Report. Spoke with pt at bedside who reports having an appetite "like a horse." Pt reports being hungry at RD time of visit. Pt states he "lost 3 lbs in jail" because he did not like the food served there. Pt denies unintentional wt loss otherwise.  Wt Readings from Last 15 Encounters:  12/10/17 154 lb (69.9 kg)  06/22/16 141 lb (64 kg)  05/05/16 164 lb (74.4 kg)  06/20/15 150 lb (68 kg)  05/23/15 151 lb (68.5 kg)    There is no height or weight on file to calculate BMI. Using weight and height from previous encounter, pt's BMI is 22.54 kg/m^2. Patient meets criteria for Normal Weight based on current BMI.   Current diet order is NPO pending EGD. Labs and medications reviewed.   No nutrition interventions warranted at this time. If nutrition issues arise, please consult RD.   Earma ReadingKate Jablonski Braylinn Gulden, MS, RD, LDN Pager: [phone] Weekend/After Hours: [phone]
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
## 2018-01-01 NOTE — Interval H&P Note (Signed)
History and Physical Interval Note:  01/01/2018 1:33 PM  Christopher Christensen  has presented today for surgery, with the diagnosis of HEMATEMESIS  The various methods of treatment have been discussed with the patient and family. After consideration of risks, benefits and other options for treatment, the patient has consented to  Procedure(s): ESOPHAGOGASTRODUODENOSCOPY (EGD) WITH PROPOFOL (N/A) as a surgical intervention .  The patient's history has been reviewed, patient examined, no change in status, stable for surgery.  I have reviewed the patient's chart and labs.  Questions were answered to the patient's satisfaction.     [place], [place]

## 2018-01-01 NOTE — Progress Notes (Signed)
Abdominal pain, hematemesis.  Patient takes at least 10 of BC every day for the last few years.  Patient states that he takes it for neck pain.  He is on Protonix drip at this time, hemoglobin is around 14. 1.  Hematemesis likely secondary to upper GI bleed from gastritis due to excessive use BC powders' appreciate GI following the patient, continue Protonix drip, monitor serial hemoglobins.advised pt not take NSAID containing products  #3 ADHD, bipolar disorder, dyslexia, PTSD, history of suicidal ideation but denies any now.  Father told me that his mother passed a year ago and he still keeps talking about her,: Depressed.  Psychiatric consult placed, patient is on suicide watch but not IVC. Time spent;20 min

## 2018-01-01 NOTE — Transfer of Care (Signed)
Immediate Anesthesia Transfer of Care Note  Patient: Christopher Christensen  Procedure(s) Performed: ESOPHAGOGASTRODUODENOSCOPY (EGD) WITH PROPOFOL (N/A )  Patient Location: Endoscopy Unit  Anesthesia Type:General  Level of Consciousness: awake  Airway & Oxygen Therapy: Patient Spontanous Breathing  Post-op Assessment: Report given to RN and Post -op Vital signs reviewed and stable  Post vital signs: Reviewed  Last Vitals:  Vitals Value Taken Time  BP 82/52 01/01/2018  4:07 PM  Temp    Pulse 70 01/01/2018  4:07 PM  Resp 20 01/01/2018  4:07 PM  SpO2 98 % 01/01/2018  4:07 PM  Vitals shown include unvalidated device data.  Last Pain:  Vitals:   01/01/18 1336  TempSrc: Tympanic  PainSc: 0-No pain         Complications: No apparent anesthesia complications
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
## 2018-01-01 NOTE — Progress Notes (Signed)
Notified Dr Anne HahnWillis of patient request-cleared by Dr Toni Amendlapacs, no discharge orders.  Will contact prim doc early am for discharge.

## 2018-01-01 NOTE — Interval H&P Note (Signed)
History and Physical Interval Note:  01/01/2018 3:46 PM  Christopher Christensen  has presented today for surgery, with the diagnosis of HEMATEMESIS  The various methods of treatment have been discussed with the patient and family. After consideration of risks, benefits and other options for treatment, the patient has consented to  Procedure(s): ESOPHAGOGASTRODUODENOSCOPY (EGD) WITH PROPOFOL (N/A) as a surgical intervention .  The patient's history has been reviewed, patient examined, no change in status, stable for surgery.  I have reviewed the patient's chart and labs.  Questions were answered to the patient's satisfaction.     Prospect Parkoledo, [place]

## 2018-01-01 NOTE — Consult Note (Signed)
Psychiatry

## 2018-01-01 NOTE — Op Note (Signed)
[facility] Patient Name: Christopher Christensen Procedure Date: 01/01/2018 3:45 PM MRN: [id number] Account #: [id number] Date of Birth: 1996/05/26 Admit Type: Inpatient Age: [age] [facility] ENDO ROOM 2 Gender: Male Note Status: Finalized Procedure:            Upper GI endoscopy Indications:          Recent gastrointestinal bleeding, Suspected upper                        gastrointestinal bleeding Providers:            Boykin Nearingeodoro K. Bettyanne Dittman MD, MD Medicines:            Propofol per Anesthesia Complications:        No immediate complications. Procedure:            Pre-Anesthesia Assessment:                       - The risks and benefits of the procedure and the                        sedation options and risks were discussed with the                        patient. All questions were answered and informed                        consent was obtained.                       - Patient identification and proposed procedure were                        verified prior to the procedure by the nurse. The                        procedure was verified in the procedure room.                       After obtaining informed consent, the endoscope was                        passed under direct vision. Throughout the procedure,                        the patient's blood pressure, pulse, and oxygen                        saturations were monitored continuously. The Endoscope                        was introduced through the mouth, and advanced to the                        third part of duodenum. The upper GI endoscopy was                        accomplished without difficulty. The patient tolerated                        the procedure well. Findings:  The examined esophagus was normal.      Two non-bleeding cratered gastric ulcers with no stigmata of bleeding       were found in the prepyloric region of the stomach. The largest lesion       was 6 mm in largest dimension.  Biopsies were taken with a cold forceps       for Helicobacter pylori testing.      A small hiatal hernia was present.      The examined duodenum was normal.      The exam was otherwise without abnormality. Impression:           - Normal esophagus.                       - Non-bleeding gastric ulcers with no stigmata of                        bleeding. Biopsied.                       - Small hiatal hernia.                       - Normal examined duodenum.                       - The examination was otherwise normal. Recommendation:       - Return patient to hospital ward for possible                        discharge same day.                       - Resume previous diet. Procedure Code(s):    --- Professional ---                       [phone], Esophagogastroduodenoscopy, flexible, transoral;                        with biopsy, single or multiple Diagnosis Code(s):    --- Professional ---                       K25.9, Gastric ulcer, unspecified as acute or chronic,                        without hemorrhage or perforation                       K44.9, Diaphragmatic hernia without obstruction or                        gangrene                       K92.2, Gastrointestinal hemorrhage, unspecified CPT copyright 2017 American Medical Association. All rights reserved. The codes documented in this report are preliminary and upon coder review may  be revised to meet current compliance requirements. Stanton Kidney MD, MD 01/01/2018 4:08:06 PM This report has been signed electronically. Number of Addenda: 0 Note Initiated On: 01/01/2018 3:45 PM      [facility]
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
## 2018-01-01 NOTE — ED Notes (Signed)
Patient transported to CT 

## 2018-01-01 NOTE — Anesthesia Post-op Follow-up Note (Signed)
Anesthesia QCDR form completed.        

## 2018-01-01 NOTE — Anesthesia Postprocedure Evaluation (Signed)
Anesthesia Post Note  Patient: Army Chacoustin J Staiger  Procedure(s) Performed: ESOPHAGOGASTRODUODENOSCOPY (EGD) WITH PROPOFOL (N/A )  Patient location during evaluation: PACU Anesthesia Type: General Level of consciousness: awake and alert and oriented Pain management: pain level controlled Vital Signs Assessment: post-procedure vital signs reviewed and stable Respiratory status: spontaneous breathing Cardiovascular status: blood pressure returned to baseline Anesthetic complications: no     Last Vitals:  Vitals:   01/01/18 1627 01/01/18 1637  BP: (!) 93/49 (!) 91/44  Pulse: 71 66  Resp: (!) 21 17  Temp:    SpO2: 98% 100%    Last Pain:  Vitals:   01/01/18 1637  TempSrc:   PainSc: 0-No pain                 Deardra Hinkley
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
## 2018-01-02 LAB — HIV ANTIBODY (ROUTINE TESTING W REFLEX): HIV Screen 4th Generation wRfx: NONREACTIVE

## 2018-01-02 MED ORDER — ONDANSETRON HCL 4 MG PO TABS: 4.0000 mg | ORAL_TABLET | Freq: Every day | ORAL | 1 refills | Status: AC | PRN

## 2018-01-02 MED ORDER — PANTOPRAZOLE SODIUM 40 MG PO TBEC: 40.0000 mg | DELAYED_RELEASE_TABLET | Freq: Two times a day (BID) | ORAL | 1 refills | Status: AC

## 2018-01-02 NOTE — Progress Notes (Signed)
[facility] - [place] at [facility]    Christopher Christensen was admitted to the Hospital on 12/31/2017 and Discharged  01/02/2018 and should be excused from work/school   for 3  days starting 12/31/2017 , may return to work/school without any restrictions.  Call Ihor AustinPavan Sanchez Hemmer MD with questions.  Ihor AustinPavan Shaneque Merkle M.D on 01/02/2018,at 8:26 AM  [facility] - [place] at [facility]    Office  [phone]

## 2018-01-02 NOTE — Progress Notes (Signed)
Patient alert and awake, significant other at bedside, denies pain, ready to go home, feels well, d/c instructions reviewed with patient, patient to f/u with GI, contact number provided, diet recommendations for gastritis provided, RX provided, verbalizes understanding, no questions at discharge.

## 2018-01-03 NOTE — Discharge Summary (Signed)
[facility] - [place] at [facility], [place] y.o., DOB 11-08-1995, MRN [id number]. Admission date: 12/31/2017 Discharge Date 01/02/2018 Primary MD Patient, No Pcp Per Admitting Physician Arnaldo Natal, MD  Admission Diagnosis   1.  Hematemesis 2 leukocytosis secondary to recurrent vomiting. 3.  Hypertension 4.  Bipolar disorder  Discharge Diagnosis       1.  Nonbleeding gastric ulcer 2 diaphragmatic hernia. 3 hypertension. 4.  Tobacco abuse 5.  Substance abuse mood disorder  Hospital Course  A 22 year old male patient with history of substance abuse, tobacco abuse was admitted to hospital on 01/01/2018 for vomiting of blood.  Patient was admitted to the medical floor started on IV Protonix drip and received IV fluids.  At the time of admission with history of bipolar disorder patient had a commanding hallucinations psychiatry consultation was done.. Gastroenterology consultation was also done.  Patient had endoscopy which showed nonbleeding gastric ulcer.  Patient was switched to IV Protonix 40 mg twice daily.  He tolerated diet well.  Inpatient psychiatric consultation was done.  Patient was found to have moodiness and impulsivity but no evidence of any psychosis and no evidence of any suicidal tendency.  Patient was cleared for discharge to home by psychiatry attending by Dr. Toni Amend.  Patient will be discharged home on oral proton pump inhibitor as recommended by gastroenterology.  Tobacco cessation counseled to the patient.  Patient discharged home on 01/02/2018.  Consults    Significant Tests:  See full reports for all details    Ct Abdomen Pelvis W Contrast  Result Date: 01/01/2018 CLINICAL DATA:  Vomiting blood today. Patient was seen here a couple of weeks ago and told he has gastritis. EXAM: CT ABDOMEN AND PELVIS WITH CONTRAST TECHNIQUE: Multidetector CT imaging of the abdomen and pelvis was performed using the standard protocol following bolus  administration of intravenous contrast. CONTRAST:  ISOVUE-300 IOPAMIDOL (ISOVUE-300) INJECTION 61% COMPARISON:  06/06/2014 FINDINGS: Lower chest: Lung bases are clear. Hepatobiliary: No focal liver lesions. Gallbladder is somewhat contracted, likely physiologic in a nonfasting patient. No bile duct dilatation. Pancreas: Unremarkable. No pancreatic ductal dilatation or surrounding inflammatory changes. Spleen: Normal in size without focal abnormality. Adrenals/Urinary Tract: Adrenal glands are unremarkable. Kidneys are normal, without renal calculi, focal lesion, or hydronephrosis. Bladder is unremarkable. Stomach/Bowel: Stomach, small bowel, and colon are mostly decompressed. Under distention limits evaluation of the wall. However, there is suggestion of wall thickening along the greater curvature of the stomach which may indicate gastritis. Suggestion of wall thickening in some of the left upper abdominal jejunum possibly representing jejunitis. No evidence of obstruction. Appendix is normal. Vascular/Lymphatic: No significant vascular findings are present. No enlarged abdominal or pelvic lymph nodes. Reproductive: Prostate is unremarkable. Other: No abdominal wall hernia or abnormality. No abdominopelvic ascites. Musculoskeletal: No acute or significant osseous findings. IMPRESSION: 1. Decompression of stomach and small bowel limits evaluation of the wall but there is suggestion of wall thickening along the greater curvature of the stomach and in some of the left upper quadrant jejunum suggesting possible gastrojejunitis. No evidence of obstruction or focal mass. 2. No acute process otherwise suggested. Electronically Signed   By: Burman Nieves M.D.   On: 01/01/2018 01:27   Dg Abd Portable 2 Views  Result Date: 12/31/2017 CLINICAL DATA:  Abdominal pain and hematemesis. EXAM: PORTABLE ABDOMEN - 2 VIEW COMPARISON:  CT chest, abdomen, and pelvis dated June 06, 2014. FINDINGS: The bowel gas pattern is  normal. There is no evidence  of free air. Large colonic stool burden. No radio-opaque calculi or other significant radiographic abnormality is seen. IMPRESSION: 1. Prominent stool throughout the colon favors constipation. No obstruction. Electronically Signed   By: Obie DredgeWilliam T Derry M.D.   On: 12/31/2017 23:53       Today   Subjective:   Christopher Christensen 22 year old male patient in no acute distress No vomiting of blood No coughing of blood Tolerated diet well  Objective:   Blood pressure 107/66, pulse 76, temperature 97.7 F (36.5 C), temperature source Oral, resp. rate 19, height 5\' 9"  (1.753 m), weight 69.6 kg (153 lb 8 oz), SpO2 100 %.  . No intake or output data in the 24 hours ending 01/03/18 1415  Exam VITAL SIGNS: Blood pressure 107/66, pulse 76, temperature 97.7 F (36.5 C), temperature source Oral, resp. rate 19, height 5\' 9"  (1.753 m), weight 69.6 kg (153 lb 8 oz), SpO2 100 %.  GENERAL:  22 y.o.-year-old patient lying in the bed with no acute distress.  EYES: Pupils equal, round, reactive to light and accommodation. No scleral icterus. Extraocular muscles intact.  HEENT: Head atraumatic, normocephalic. Oropharynx and nasopharynx clear.  NECK:  Supple, no jugular venous distention. No thyroid enlargement, no tenderness.  LUNGS: Normal breath sounds bilaterally, no wheezing, rales,rhonchi or crepitation. No use of accessory muscles of respiration.  CARDIOVASCULAR: S1, S2 normal. No murmurs, rubs, or gallops.  ABDOMEN: Soft, nontender, nondistended. Bowel sounds present. No organomegaly or mass.  EXTREMITIES: No pedal edema, cyanosis, or clubbing.  NEUROLOGIC: Cranial nerves II through XII are intact. Muscle strength 5/5 in all extremities. Sensation intact. Gait not checked.  PSYCHIATRIC: The patient is alert and oriented x 3.  SKIN: No obvious rash, lesion, or ulcer.   Data Review     CBC w Diff:  Lab Results  Component Value Date   WBC 17.2 (H) 12/31/2017   HGB 14.0  01/01/2018   HGB 12.7 (L) 06/06/2014   HCT 41.8 01/01/2018   HCT 38.3 (L) 06/06/2014   PLT 310 12/31/2017   PLT 217 06/06/2014   LYMPHOPCT 28 12/10/2017   MONOPCT 9 12/10/2017   EOSPCT 5 12/10/2017   BASOPCT 1 12/10/2017   CMP:  Lab Results  Component Value Date   NA 140 12/31/2017   NA 139 06/06/2014   K 3.5 12/31/2017   K 4.2 06/06/2014   CL 106 12/31/2017   CL 109 (H) 06/06/2014   CO2 28 12/31/2017   CO2 25 06/06/2014   BUN 13 12/31/2017   BUN 6 (L) 06/06/2014   CREATININE 1.09 12/31/2017   CREATININE 0.92 06/06/2014   PROT 6.0 (L) 12/31/2017   ALBUMIN 3.7 12/31/2017   BILITOT 0.7 12/31/2017   ALKPHOS 42 12/31/2017   AST 24 12/31/2017   ALT 10 (L) 12/31/2017  .  Micro Results No results found for this or any previous visit (from the past 240 hour(s)).   Code Status History    Date Active Date Inactive Code Status Order ID Comments User Context   01/01/2018 0431 01/02/2018 1544 Full Code [id number]  Arnaldo Nataliamond, Michael S, MD Inpatient   06/22/2016 1302 06/23/2016 2103 Full Code [id number]  Darliss RidgelKapur, Aarti K, MD Inpatient   06/22/2016 1043 06/22/2016 1047 Full Code [id number]  Darliss RidgelKapur, Aarti K, MD ED          Follow-up Information    Pasty Spillersahiliani, Varnita B, MD. Schedule an appointment as soon as possible for a visit in 2 week(s).   Specialty:  Gastroenterology Contact information: [address]  [place] [address] [phone]           Discharge Medications   Allergies as of 01/02/2018      Reactions   Fentanyl Anaphylaxis      Medication List    STOP taking these medications   albuterol 108 (90 Base) MCG/ACT inhaler Commonly known as:  PROVENTIL HFA;VENTOLIN HFA   citalopram 20 MG tablet Commonly known as:  CELEXA   erythromycin ophthalmic ointment   sucralfate 1 GM/10ML suspension Commonly known as:  CARAFATE     TAKE these medications   famotidine 20 MG tablet Commonly known as:  PEPCID Take 1 tablet (20 mg total) by mouth 2 (two) times  daily.   ondansetron 4 MG disintegrating tablet Commonly known as:  ZOFRAN ODT Take 1 tablet (4 mg total) by mouth every 8 (eight) hours as needed for nausea or vomiting.   ondansetron 4 MG tablet Commonly known as:  ZOFRAN Take 1 tablet (4 mg total) by mouth daily as needed for nausea or vomiting.   pantoprazole 40 MG tablet Commonly known as:  PROTONIX Take 1 tablet (40 mg total) by mouth 2 (two) times daily.          Total Time in preparing paper work, data evaluation and todays exam - 35 minutes  Ihor Tykeem M.D on 01/03/2018 at 2:15 PM [facility]   Office  [phone]

## 2018-01-04 ENCOUNTER — Encounter: Payer: Self-pay | Admitting: Internal Medicine

## 2018-01-06 LAB — SURGICAL PATHOLOGY

## 2019-07-07 ENCOUNTER — Emergency Department
Admission: EM | Admit: 2019-07-07 | Discharge: 2019-07-07 | Disposition: A | Discharge status: Home / Self Care | Payer: Self-pay | Attending: Emergency Medicine | Admitting: Emergency Medicine

## 2019-07-07 ENCOUNTER — Other Ambulatory Visit: Payer: Self-pay

## 2019-07-07 ENCOUNTER — Emergency Department: Payer: Self-pay

## 2019-07-07 ENCOUNTER — Encounter: Payer: Self-pay | Admitting: Intensive Care

## 2019-07-07 DIAGNOSIS — K279 Peptic ulcer, site unspecified, unspecified as acute or chronic, without hemorrhage or perforation: Secondary | ICD-10-CM | POA: Insufficient documentation

## 2019-07-07 DIAGNOSIS — R109 Unspecified abdominal pain: Secondary | ICD-10-CM

## 2019-07-07 DIAGNOSIS — F17200 Nicotine dependence, unspecified, uncomplicated: Secondary | ICD-10-CM | POA: Insufficient documentation

## 2019-07-07 DIAGNOSIS — J45909 Unspecified asthma, uncomplicated: Secondary | ICD-10-CM | POA: Insufficient documentation

## 2019-07-07 DIAGNOSIS — I10 Essential (primary) hypertension: Secondary | ICD-10-CM | POA: Insufficient documentation

## 2019-07-07 LAB — URINALYSIS, COMPLETE (UACMP) WITH MICROSCOPIC
Bacteria, UA: NONE SEEN
Bilirubin Urine: NEGATIVE
Glucose, UA: NEGATIVE mg/dL
Hgb urine dipstick: NEGATIVE
Ketones, ur: NEGATIVE mg/dL
Leukocytes,Ua: NEGATIVE
Nitrite: NEGATIVE
Protein, ur: NEGATIVE mg/dL
Specific Gravity, Urine: 1.004 — ABNORMAL LOW (ref 1.005–1.030)
Squamous Epithelial / LPF: NONE SEEN (ref 0–5)
pH: 7 (ref 5.0–8.0)

## 2019-07-07 LAB — HEMOGLOBIN AND HEMATOCRIT, BLOOD
HCT: 41.9 % (ref 39.0–52.0)
Hemoglobin: 14 g/dL (ref 13.0–17.0)

## 2019-07-07 LAB — CBC
HCT: 45 % (ref 39.0–52.0)
Hemoglobin: 15.2 g/dL (ref 13.0–17.0)
MCH: 30 pg (ref 26.0–34.0)
MCHC: 33.8 g/dL (ref 30.0–36.0)
MCV: 88.8 fL (ref 80.0–100.0)
Platelets: 300 10*3/uL (ref 150–400)
RBC: 5.07 MIL/uL (ref 4.22–5.81)
RDW: 13 % (ref 11.5–15.5)
WBC: 8.8 10*3/uL (ref 4.0–10.5)
nRBC: 0 % (ref 0.0–0.2)

## 2019-07-07 LAB — COMPREHENSIVE METABOLIC PANEL
ALT: 9 U/L (ref 0–44)
AST: 20 U/L (ref 15–41)
Albumin: 4.1 g/dL (ref 3.5–5.0)
Alkaline Phosphatase: 44 U/L (ref 38–126)
Anion gap: 9 (ref 5–15)
BUN: 7 mg/dL (ref 6–20)
CO2: 25 mmol/L (ref 22–32)
Calcium: 9.2 mg/dL (ref 8.9–10.3)
Chloride: 104 mmol/L (ref 98–111)
Creatinine, Ser: 0.92 mg/dL (ref 0.61–1.24)
GFR calc Af Amer: >60 mL/min (ref >60)
GFR calc non Af Amer: >60 mL/min (ref >60)
Glucose, Bld: 86 mg/dL (ref 70–99)
Potassium: 4.1 mmol/L (ref 3.5–5.1)
Sodium: 138 mmol/L (ref 135–145)
Total Bilirubin: 0.7 mg/dL (ref 0.3–1.2)
Total Protein: 6.8 g/dL (ref 6.5–8.1)

## 2019-07-07 LAB — LIPASE, BLOOD: Lipase: 22 U/L (ref 11–51)

## 2019-07-07 MED ORDER — ONDANSETRON HCL 4 MG/2ML IJ SOLN
4.0000 mg | Freq: Once | INTRAMUSCULAR | Status: AC
  Administered 2019-07-07: 4 mg via INTRAVENOUS
  Filled 2019-07-07: qty 2

## 2019-07-07 MED ORDER — ALUM & MAG HYDROXIDE-SIMETH 200-200-20 MG/5ML PO SUSP
30.0000 mL | Freq: Once | ORAL | Status: AC
  Administered 2019-07-07: 30 mL via ORAL
  Filled 2019-07-07: qty 30

## 2019-07-07 MED ORDER — PROMETHAZINE HCL 25 MG PO TABS: 25.0000 mg | ORAL_TABLET | Freq: Three times a day (TID) | ORAL | 0 refills | Status: AC | PRN

## 2019-07-07 MED ORDER — OMEPRAZOLE 20 MG PO CPDR: 20.0000 mg | DELAYED_RELEASE_CAPSULE | Freq: Two times a day (BID) | ORAL | 0 refills | Status: AC

## 2019-07-07 MED ORDER — PANTOPRAZOLE SODIUM 40 MG IV SOLR
40.0000 mg | Freq: Once | INTRAVENOUS | Status: AC
  Administered 2019-07-07: 40 mg via INTRAVENOUS
  Filled 2019-07-07: qty 40

## 2019-07-07 MED ORDER — PROMETHAZINE HCL 25 MG PO TABS: 25.0000 mg | ORAL_TABLET | Freq: Three times a day (TID) | ORAL | 0 refills | Status: DC | PRN

## 2019-07-07 MED ORDER — SODIUM CHLORIDE 0.9 % IV BOLUS
1000.0000 mL | Freq: Once | INTRAVENOUS | Status: AC
  Administered 2019-07-07: 1000 mL via INTRAVENOUS

## 2019-07-07 MED ORDER — FAMOTIDINE 20 MG PO TABS
20.0000 mg | ORAL_TABLET | Freq: Two times a day (BID) | ORAL | 0 refills | Status: AC
Start: 2019-07-07 — End: 2019-07-17

## 2019-07-07 MED ORDER — LIDOCAINE VISCOUS HCL 2 % MT SOLN
15.0000 mL | Freq: Once | OROMUCOSAL | Status: AC
  Administered 2019-07-07: 20:00:00 15 mL via ORAL
  Filled 2019-07-07: qty 15
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
## 2019-07-07 NOTE — ED Notes (Signed)
Pt provided graham crackers and cola for PO challenge.

## 2019-07-07 NOTE — ED Triage Notes (Signed)
Patient presents with abd burning for a couple of days. Reports his ulcers are acting up and causing vomiting.

## 2019-07-07 NOTE — Discharge Instructions (Addendum)
As we discussed, you have the option of being admitted for scope versus treating with medications for possible and likely ulcer disease.  Your lab work was overall reassuring today.  I have provided prescriptions for 2 medications, famotidine and omeprazole.  These are over-the-counter medicines, but I have written them as I would like you to take them.  I recommend finding the generic version of them and buying at [facility] or other generic pharmacy.  Avoid alcohol, aspirin, Goody's powders, or other NSAID medications.  Avoid spicy food.

## 2019-07-29 ENCOUNTER — Ambulatory Visit: Admission: RE | Admit: 2019-07-29 | Payer: Self-pay | Source: Home / Self Care | Admitting: General Surgery

## 2019-07-29 ENCOUNTER — Encounter: Admission: RE | Payer: Self-pay | Source: Home / Self Care

## 2019-07-29 SURGERY — ESOPHAGOGASTRODUODENOSCOPY (EGD) WITH PROPOFOL
Anesthesia: General

## 2019-09-17 ENCOUNTER — Emergency Department: Payer: Self-pay

## 2019-09-17 ENCOUNTER — Encounter: Payer: Self-pay | Admitting: Emergency Medicine

## 2019-09-17 ENCOUNTER — Emergency Department
Admission: EM | Admit: 2019-09-17 | Discharge: 2019-09-17 | Disposition: A | Discharge status: Home / Self Care | Payer: Self-pay | Attending: Emergency Medicine | Admitting: Emergency Medicine

## 2019-09-17 ENCOUNTER — Other Ambulatory Visit: Payer: Self-pay

## 2019-09-17 DIAGNOSIS — I10 Essential (primary) hypertension: Secondary | ICD-10-CM | POA: Insufficient documentation

## 2019-09-17 DIAGNOSIS — Y929 Unspecified place or not applicable: Secondary | ICD-10-CM | POA: Insufficient documentation

## 2019-09-17 DIAGNOSIS — Y9389 Activity, other specified: Secondary | ICD-10-CM | POA: Insufficient documentation

## 2019-09-17 DIAGNOSIS — S81012A Laceration without foreign body, left knee, initial encounter: Secondary | ICD-10-CM | POA: Insufficient documentation

## 2019-09-17 DIAGNOSIS — W293XXA Contact with powered garden and outdoor hand tools and machinery, initial encounter: Secondary | ICD-10-CM | POA: Insufficient documentation

## 2019-09-17 DIAGNOSIS — F17200 Nicotine dependence, unspecified, uncomplicated: Secondary | ICD-10-CM | POA: Insufficient documentation

## 2019-09-17 DIAGNOSIS — Y999 Unspecified external cause status: Secondary | ICD-10-CM | POA: Insufficient documentation

## 2019-09-17 DIAGNOSIS — J45909 Unspecified asthma, uncomplicated: Secondary | ICD-10-CM | POA: Insufficient documentation

## 2019-09-17 MED ORDER — LIDOCAINE HCL 1 % IJ SOLN
5.0000 mL | Freq: Once | INTRAMUSCULAR | Status: AC
  Administered 2019-09-17: 17:00:00 5 mL
  Filled 2019-09-17: qty 10

## 2019-09-17 MED ORDER — CEPHALEXIN 500 MG PO CAPS: 500.0000 mg | ORAL_CAPSULE | Freq: Three times a day (TID) | ORAL | 0 refills | Status: AC

## 2019-09-17 NOTE — ED Notes (Signed)
Lac to left knee with chainsaw. Bleeding is controlled.

## 2019-09-17 NOTE — ED Notes (Signed)
Dressing applied to left knee at this time.
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
## 2019-09-17 NOTE — ED Triage Notes (Signed)
States about one hour ago cut L knee with chainsaw. Has bandage over with no bleed through.

## 2019-09-17 NOTE — Discharge Instructions (Addendum)
Keep wound clean and dry for the next 24 hours. Return with redness or streaking surrounding wound site. Have external sutures removed in 10 days. Take Keflex 3 times daily for the next week.

## 2019-12-15 ENCOUNTER — Other Ambulatory Visit: Payer: Self-pay

## 2019-12-15 ENCOUNTER — Emergency Department
Admission: EM | Admit: 2019-12-15 | Discharge: 2019-12-15 | Disposition: A | Discharge status: Home / Self Care | Payer: Self-pay | Attending: Emergency Medicine | Admitting: Emergency Medicine

## 2019-12-15 DIAGNOSIS — Y999 Unspecified external cause status: Secondary | ICD-10-CM | POA: Insufficient documentation

## 2019-12-15 DIAGNOSIS — Y9389 Activity, other specified: Secondary | ICD-10-CM | POA: Insufficient documentation

## 2019-12-15 DIAGNOSIS — Z5321 Procedure and treatment not carried out due to patient leaving prior to being seen by health care provider: Secondary | ICD-10-CM | POA: Insufficient documentation

## 2019-12-15 DIAGNOSIS — W260XXA Contact with knife, initial encounter: Secondary | ICD-10-CM | POA: Insufficient documentation

## 2019-12-15 DIAGNOSIS — Y929 Unspecified place or not applicable: Secondary | ICD-10-CM | POA: Insufficient documentation

## 2019-12-15 NOTE — ED Triage Notes (Addendum)
Pt arrives to ED via EMS from home with c/o left hand laceration from a box cutter. Pt reports he was "cutting towel while half-asleep", but denies it was intentional. Pt arrives with left hand bandaged with bleeding controlled; pt has a picture on his cell phone that show a deep open laceration across the palm of his hand just under the left thumb.

## 2019-12-15 NOTE — ED Notes (Signed)
Pt to 1st nurse desk with injury actively bleeding. Pt's wound was re-dressed by Erie Noe, RN, after which the pt stated he was leaving to go to [facility] to seek treatment d/t the wait time. Pt strongly encouraged to stay, but his s/o (who he has been arguing with very loudly on his cell phone since his arrival) has arrived to take him elsewhere, and he again declines to wait any longer.

## 2020-02-09 IMAGING — CT CT ABD-PELV W/ CM
2 of 4 series · 16 of 46 positions shown, 18 images · IV contrast (APPLIED)
Comparison: 06/06/2014

CLINICAL DATA: Vomiting blood today. Patient was seen here a couple
of weeks ago and told he has gastritis.

EXAM:
CT ABDOMEN AND PELVIS WITH CONTRAST
TECHNIQUE: Multidetector CT imaging of the abdomen and pelvis was performed
using the standard protocol following bolus administration of
intravenous contrast.
CONTRAST:  100mL 11RYEF-133 IOPAMIDOL (11RYEF-133) INJECTION 61%

[Series 2: axial st · axial · 0.67mm/px · z∈[-839,-439]mm · 13 of 88 slices shown, 15 images]
[im 4/88  soft-tissue]
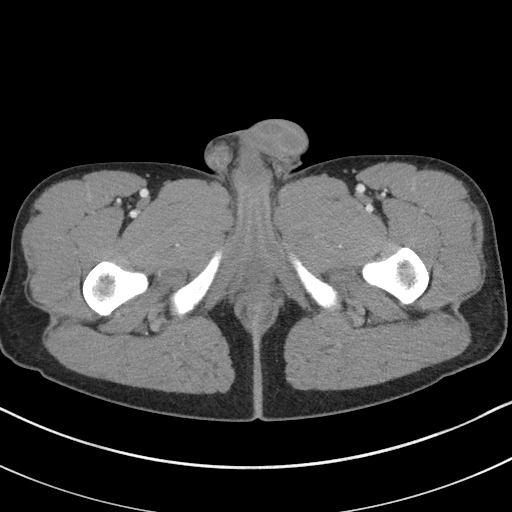
[im 4/88  bone]
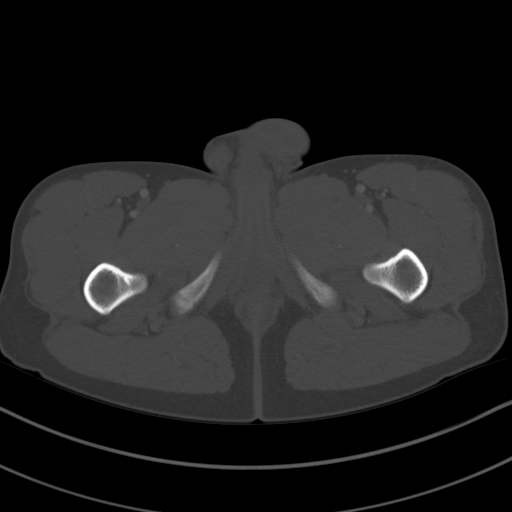
[im 11/88  soft-tissue]
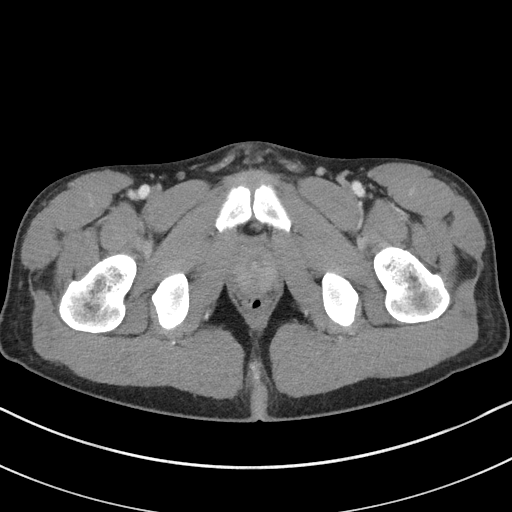
[im 17/88  soft-tissue]
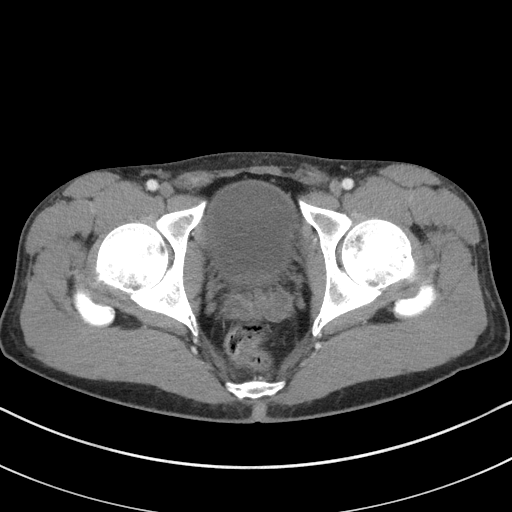
[im 24/88  soft-tissue]
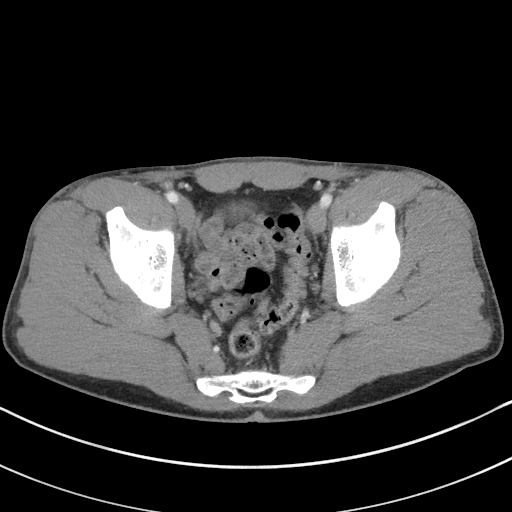
[im 31/88  soft-tissue]
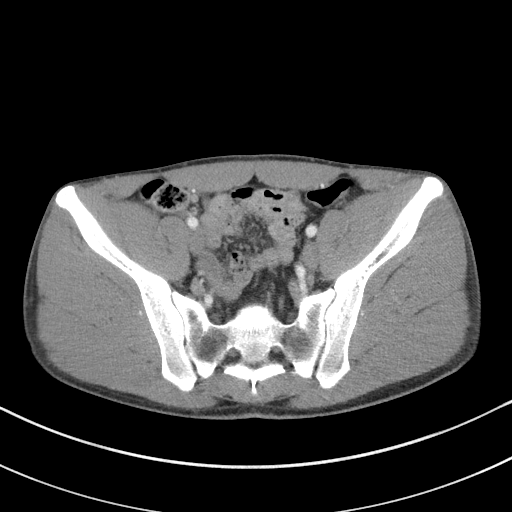
[im 37/88  soft-tissue]
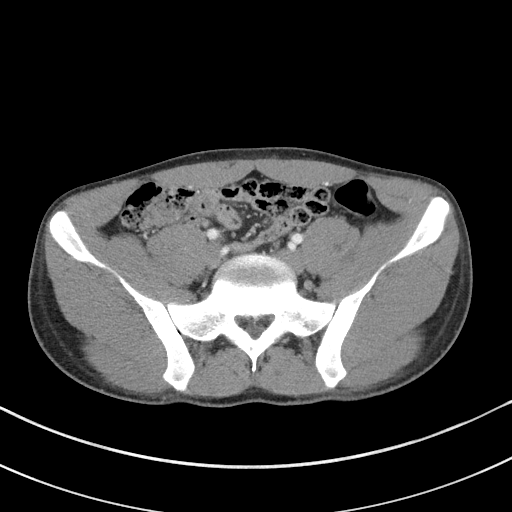
[im 44/88  soft-tissue]
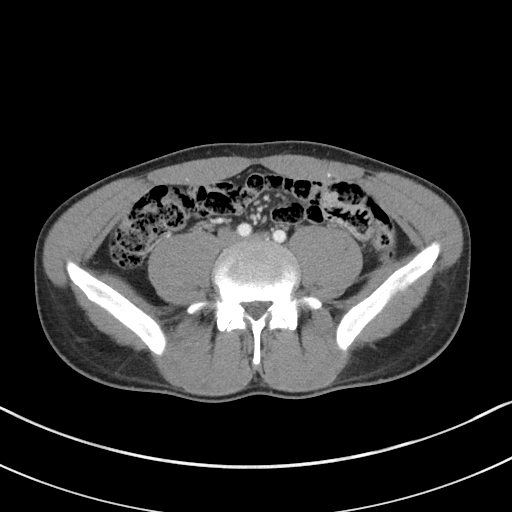
[im 51/88  soft-tissue]
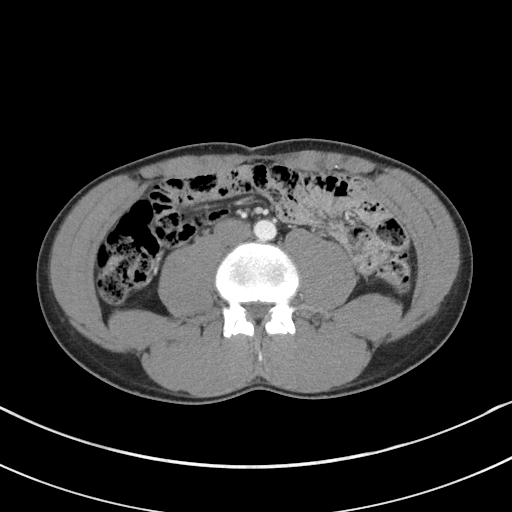
[im 57/88  soft-tissue]
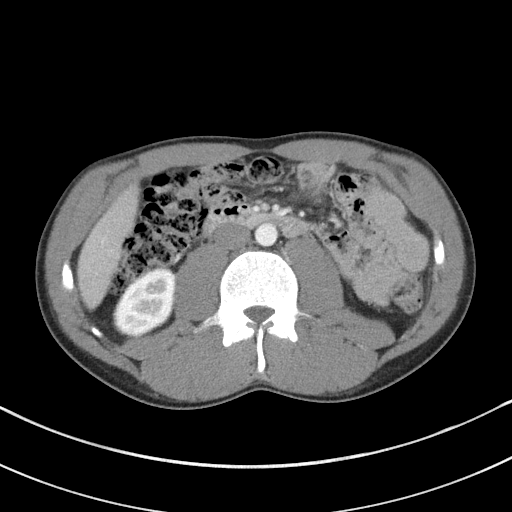
[im 57/88  bone]
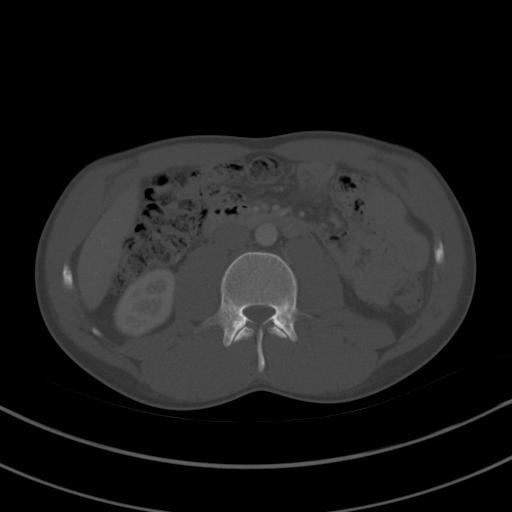
[im 64/88  soft-tissue]
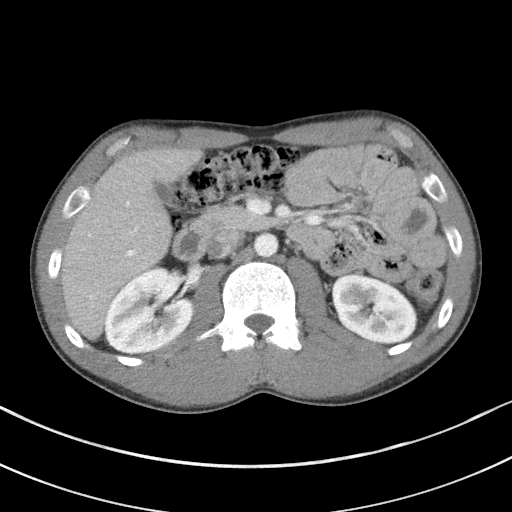
[im 71/88  soft-tissue]
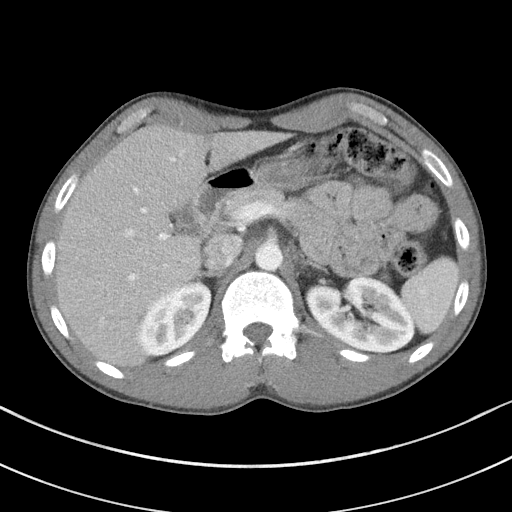
[im 77/88  soft-tissue]
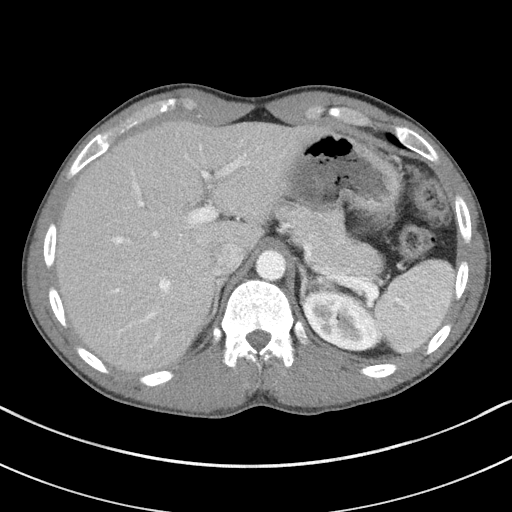
[im 84/88  soft-tissue]
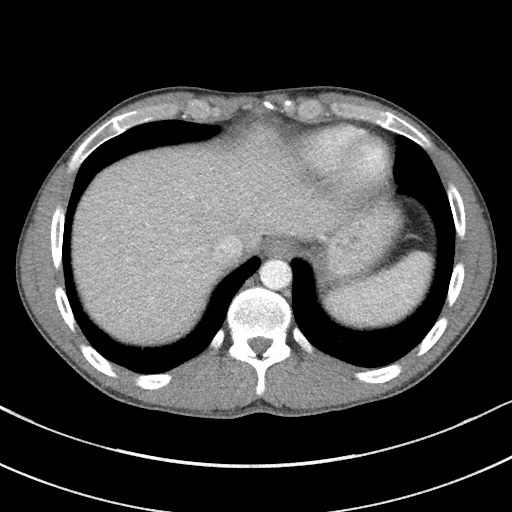

[Series 5: coronal st · coronal · 0.68mm/px · 3 of 74 slices shown]
[im 25/74  soft-tissue]
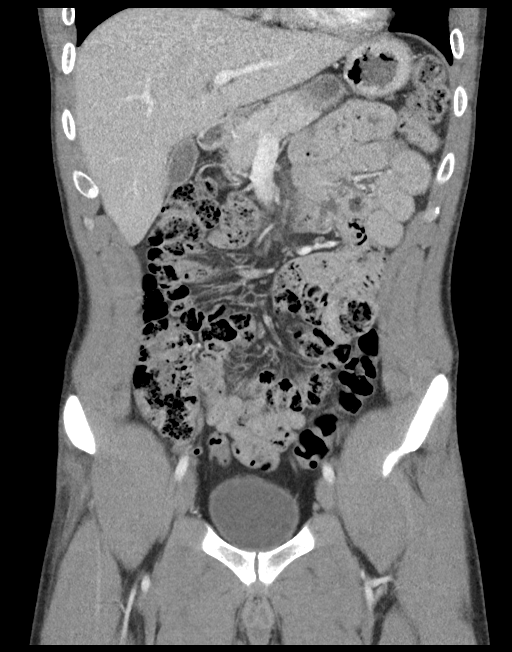
[im 33/74  soft-tissue]
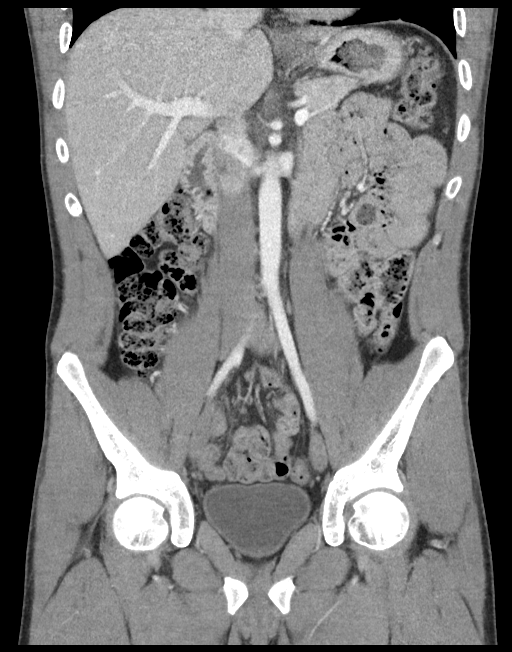
[im 41/74  soft-tissue]
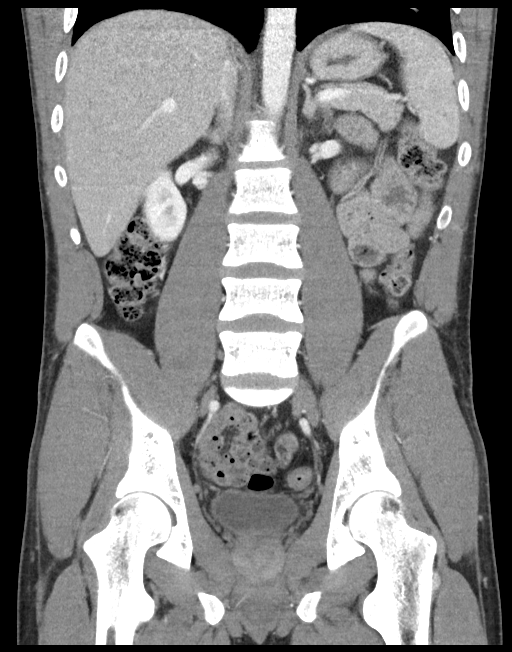

[16 of 46 positions shown; findings below may reference images not displayed]

FINDINGS: Lower chest: Lung bases are clear.

Hepatobiliary: No focal liver lesions. Gallbladder is somewhat
contracted, likely physiologic in a nonfasting patient. No bile duct
dilatation.

Pancreas: Unremarkable. No pancreatic ductal dilatation or
surrounding inflammatory changes.

Spleen: Normal in size without focal abnormality.

Adrenals/Urinary Tract: Adrenal glands are unremarkable. Kidneys are
normal, without renal calculi, focal lesion, or hydronephrosis.
Bladder is unremarkable.

Stomach/Bowel: Stomach, small bowel, and colon are mostly
decompressed. Under distention limits evaluation of the wall.
However, there is suggestion of wall thickening along the greater
curvature of the stomach which may indicate gastritis. Suggestion of
wall thickening in some of the left upper abdominal jejunum possibly
representing jejunitis. No evidence of obstruction. Appendix is
normal.

Vascular/Lymphatic: No significant vascular findings are present. No
enlarged abdominal or pelvic lymph nodes.

Reproductive: Prostate is unremarkable.

Other: No abdominal wall hernia or abnormality. No abdominopelvic
ascites.

Musculoskeletal: No acute or significant osseous findings.
IMPRESSION: 1. Decompression of stomach and small bowel limits evaluation of the
wall but there is suggestion of wall thickening along the greater
curvature of the stomach and in some of the left upper quadrant
jejunum suggesting possible gastrojejunitis. No evidence of
obstruction or focal mass.
2. No acute process otherwise suggested.

## 2021-08-14 IMAGING — CR DG ABDOMEN 2V
2 series · 2 of 2 positions shown · non-contrast
Comparison: 12/31/2017

CLINICAL DATA: Abdominal burning

EXAM:
ABDOMEN - 2 VIEW

[abdomen erect (1 of 2)]
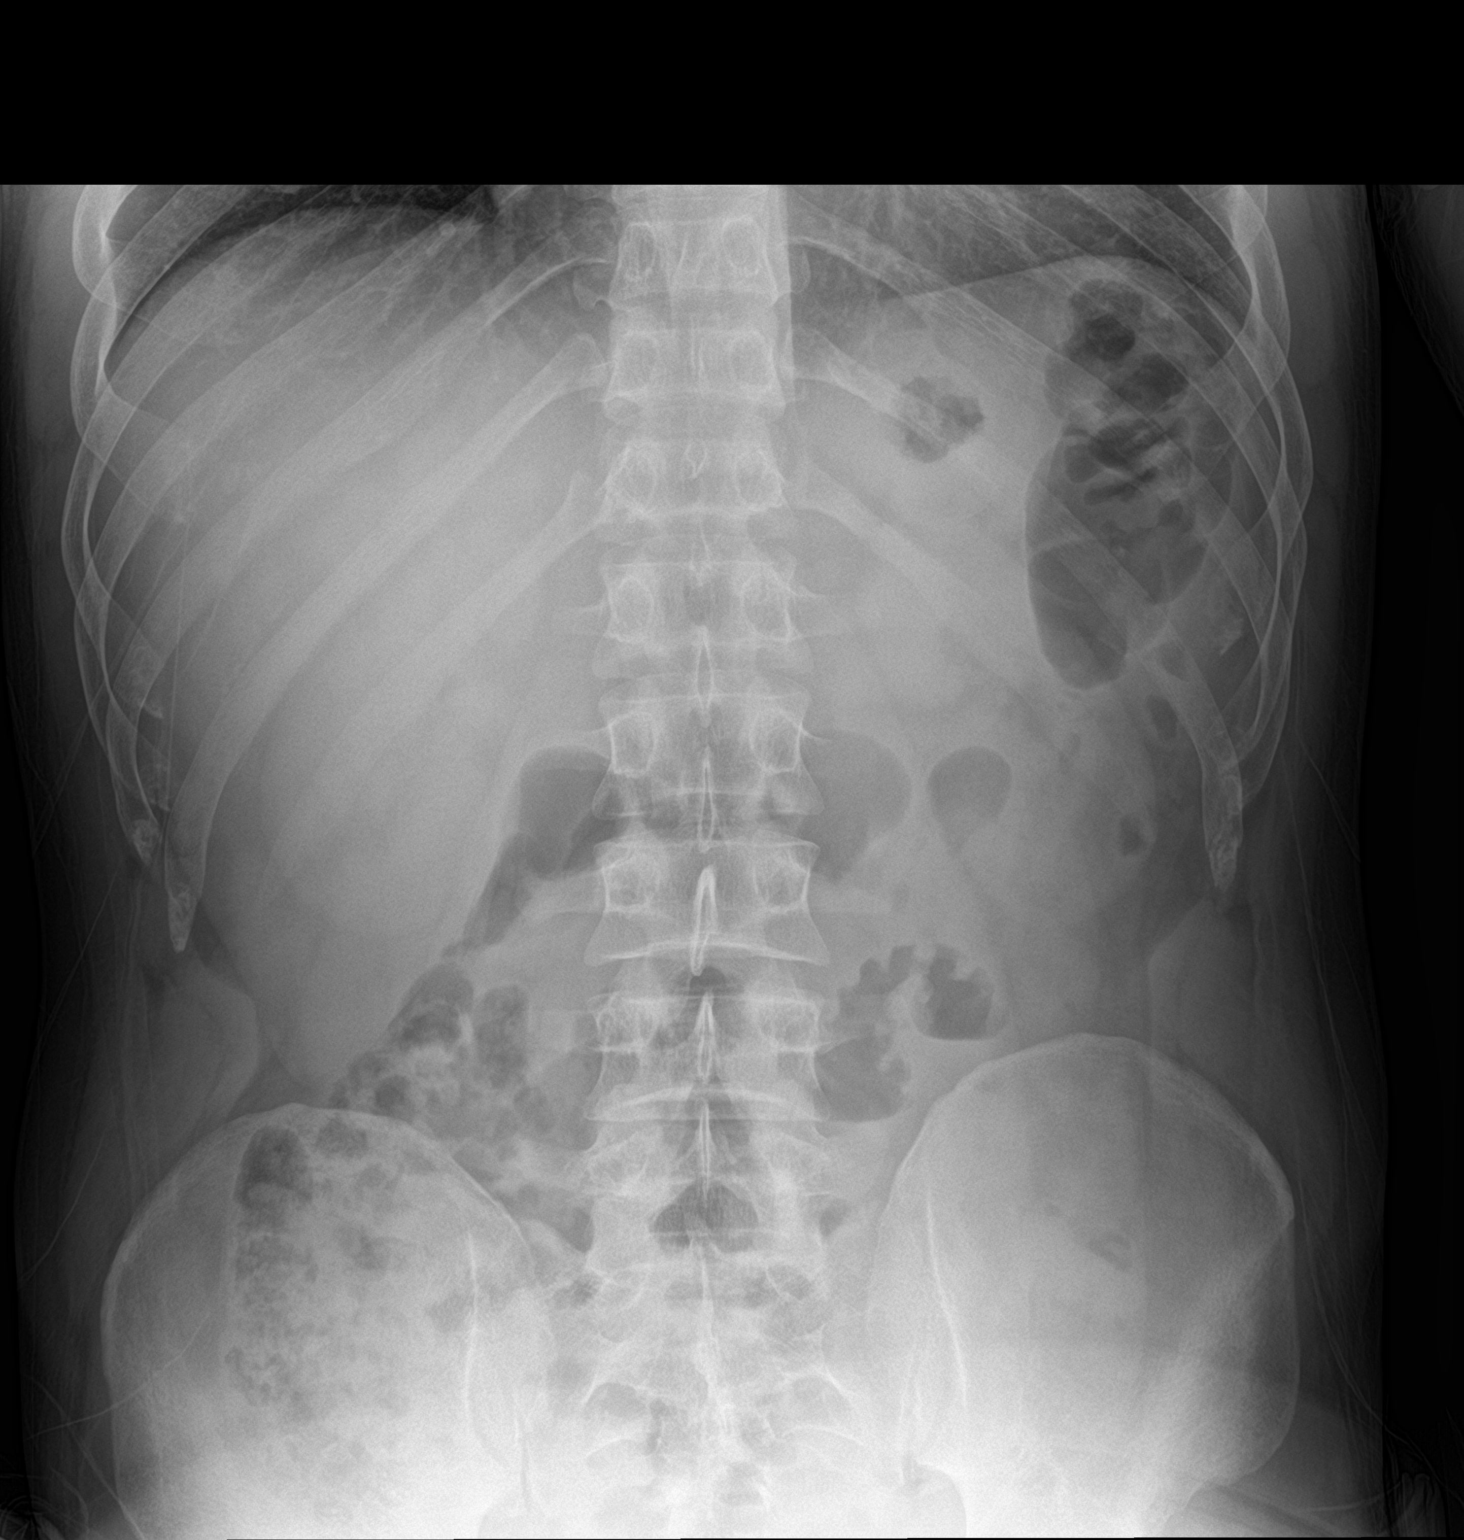

[abdomen erect (2 of 2)]
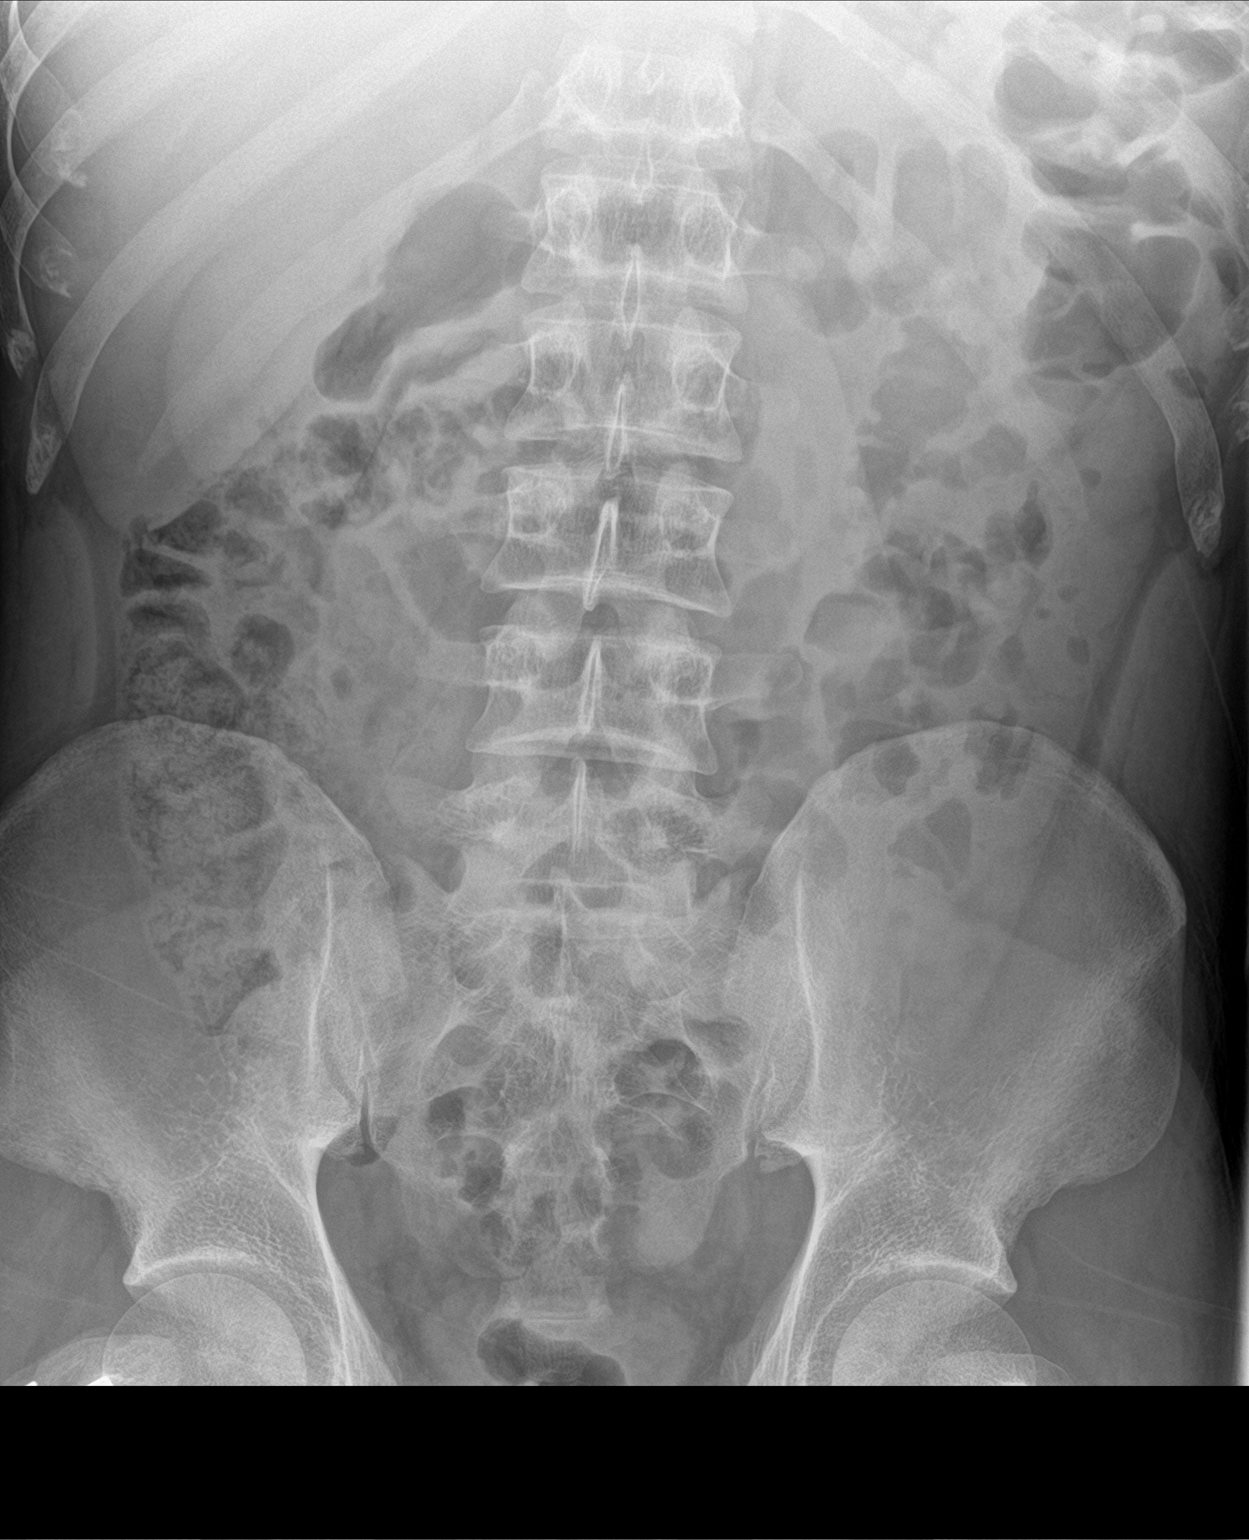

[2 of 2 positions shown; findings below may reference images not displayed]

FINDINGS: No free air beneath the diaphragm. Visible lung bases are clear.
Nonobstructed bowel-gas pattern. No radiopaque calculi.
IMPRESSION: Negative.
